# Patient Record
Sex: Male | Born: 1980 | Race: White | Hispanic: No | Marital: Single | Smoking: Current every day smoker
Health system: Southern US, Community
[De-identification: ages and names within clinical notes are randomized; demographics above are authoritative.]

---

## 2019-07-04 ENCOUNTER — Other Ambulatory Visit: Payer: Self-pay

## 2019-07-04 ENCOUNTER — Encounter: Payer: Self-pay | Admitting: Family Medicine

## 2019-07-04 ENCOUNTER — Ambulatory Visit (INDEPENDENT_AMBULATORY_CARE_PROVIDER_SITE_OTHER): Payer: BC Managed Care – PPO | Admitting: Family Medicine

## 2019-07-04 VITALS — BP 132/82 | HR 75 | Temp 98.0°F | Ht 70.08 in | Wt 165.7 lb

## 2019-07-04 DIAGNOSIS — B353 Tinea pedis: Secondary | ICD-10-CM

## 2019-07-04 DIAGNOSIS — Z113 Encounter for screening for infections with a predominantly sexual mode of transmission: Secondary | ICD-10-CM | POA: Diagnosis not present

## 2019-07-04 DIAGNOSIS — E785 Hyperlipidemia, unspecified: Secondary | ICD-10-CM | POA: Diagnosis not present

## 2019-07-04 MED ORDER — KETOCONAZOLE 2 % EX CREA: 1.0000 "application " | TOPICAL_CREAM | Freq: Every day | CUTANEOUS | 0 refills | Status: DC

## 2019-07-04 NOTE — Patient Instructions (Signed)
Great to meet you today! Try ketoconazole to toe area.  We'll be in touch with lab results and recommendations.

## 2019-07-05 LAB — LIPID PANEL
Cholesterol: 184 mg/dL (ref <200)
HDL: 45 mg/dL (ref >=40)
LDL Cholesterol (Calc): 122 mg/dL (calc) — ABNORMAL HIGH
Non-HDL Cholesterol (Calc): 139 mg/dL (calc) — ABNORMAL HIGH (ref <130)
Total CHOL/HDL Ratio: 4.1 (calc) (ref <5.0)
Triglycerides: 80 mg/dL (ref <150)

## 2019-07-05 LAB — COMPLETE METABOLIC PANEL WITH GFR
AG Ratio: 2.2 (calc) (ref 1.0–2.5)
ALT: 21 U/L (ref 9–46)
AST: 21 U/L (ref 10–40)
Albumin: 5 g/dL (ref 3.6–5.1)
Alkaline phosphatase (APISO): 54 U/L (ref 36–130)
BUN: 13 mg/dL (ref 7–25)
CO2: 29 mmol/L (ref 20–32)
Calcium: 9.9 mg/dL (ref 8.6–10.3)
Chloride: 102 mmol/L (ref 98–110)
Creat: 0.99 mg/dL (ref 0.60–1.35)
GFR, Est African American: 112 mL/min/{1.73_m2} (ref >=60)
GFR, Est Non African American: 96 mL/min/{1.73_m2} (ref >=60)
Globulin: 2.3 g/dL (calc) (ref 1.9–3.7)
Glucose, Bld: 102 mg/dL — ABNORMAL HIGH (ref 65–99)
Potassium: 4.5 mmol/L (ref 3.5–5.3)
Sodium: 141 mmol/L (ref 135–146)
Total Bilirubin: 0.8 mg/dL (ref 0.2–1.2)
Total Protein: 7.3 g/dL (ref 6.1–8.1)

## 2019-07-05 LAB — CHLAMYDIA/NEISSERIA GONORRHOEAE RNA,TMA,UROGENTIAL

## 2019-07-05 LAB — RPR: RPR Ser Ql: NONREACTIVE

## 2019-07-05 LAB — HIV ANTIBODY (ROUTINE TESTING W REFLEX): HIV 1&2 Ab, 4th Generation: NONREACTIVE

## 2019-07-07 DIAGNOSIS — E785 Hyperlipidemia, unspecified: Secondary | ICD-10-CM | POA: Insufficient documentation

## 2019-07-07 DIAGNOSIS — Z113 Encounter for screening for infections with a predominantly sexual mode of transmission: Secondary | ICD-10-CM | POA: Insufficient documentation

## 2019-07-07 DIAGNOSIS — B353 Tinea pedis: Secondary | ICD-10-CM | POA: Insufficient documentation

## 2019-07-07 NOTE — Assessment & Plan Note (Signed)
HIV, RPR, GC/CHlamydia ordered.

## 2019-07-07 NOTE — Progress Notes (Signed)
RED MANDT - 39 y.o. male MRN [id number]  Date of birth: [birth year]  Subjective Chief Complaint  Patient presents with  . Establish Care    HPI Bryan Downs is a 39 y.o. male here today for initial visit.  He has been fairly healthy.  Cholesterol has been borderline in the past, due to have this rechecked.  She does smoke occasionally.    He has concern about rash on toe.  He did televisit for this and was prescribed ciclopirox.  Area improved but didn't completely resolve.  He denies pain or itching associated with this.    He would also like to have updated STD screening.  He denies any symptoms at this time.    ROS:  A comprehensive ROS was completed and negative except as noted per HPI  No Known Allergies  History reviewed. No pertinent past medical history.  History reviewed. No pertinent surgical history.  Social History   Socioeconomic History  . Marital status: Single    Spouse name: Not on file  . Number of children: Not on file  . Years of education: Not on file  . Highest education level: Not on file  Occupational History  . Occupation: Journalist  Tobacco Use  . Smoking status: Current Every Day Smoker    Years: 6.00    Types: Cigarettes  . Smokeless tobacco: Never Used  Vaping Use  . Vaping Use: Never used  Substance and Sexual Activity  . Alcohol use: Yes    Alcohol/week: 2.0 standard drinks    Types: 2 Cans of beer per week  . Drug use: Never  . Sexual activity: Yes    Partners: Female  Other Topics Concern  . Not on file  Social History Narrative  . Not on file   Social Determinants of Health   Financial Resource Strain:   . Difficulty of Paying Living Expenses:   Food Insecurity:   . Worried About Charity fundraiser in the Last Year:   . Arboriculturist in the Last Year:   Transportation Needs:   . Film/video editor (Medical):   Marland Kitchen Lack of Transportation (Non-Medical):   Physical Activity:   . Days of Exercise per Week:    . Minutes of Exercise per Session:   Stress:   . Feeling of Stress :   Social Connections:   . Frequency of Communication with Friends and Family:   . Frequency of Social Gatherings with Friends and Family:   . Attends Religious Services:   . Active Member of Clubs or Organizations:   . Attends Archivist Meetings:   Marland Kitchen Marital Status:     History reviewed. No pertinent family history.  Health Maintenance  Topic Date Due  . Hepatitis C Screening  Never done  . TETANUS/TDAP  Never done  . INFLUENZA VACCINE  08/25/2019  . COVID-19 Vaccine  Completed  . HIV Screening  Completed     ----------------------------------------------------------------------------------------------------------------------------------------------------------------------------------------------------------------- Physical Exam BP 132/82 (BP Location: Left Arm, Patient Position: Sitting, Cuff Size: Small)   Pulse 75   Temp 98 F (36.7 C) (Oral)   Ht 5' 10.08" (1.78 m)   Wt 165 lb 11.2 oz (75.2 kg)   SpO2 97%   BMI 23.72 kg/m   Physical Exam Constitutional:      Appearance: Normal appearance.  HENT:     Head: Normocephalic and atraumatic.  Eyes:     General: No scleral icterus. Cardiovascular:     Rate and  Rhythm: Normal rate and regular rhythm.  Pulmonary:     Effort: Pulmonary effort is normal.     Breath sounds: Normal breath sounds.  Musculoskeletal:     Cervical back: Neck supple.  Skin:    General: Skin is warm and dry.  Neurological:     General: No focal deficit present.     Mental Status: He is alert.  Psychiatric:        Mood and Affect: Mood normal.        Behavior: Behavior normal.     ------------------------------------------------------------------------------------------------------------------------------------------------------------------------------------------------------------------- Assessment and Plan  Tinea pedis Improved but not resolved with  ciclopirox.  Will try ketoconazole cream to area.   Screening for STD (sexually transmitted disease) HIV, RPR, GC/CHlamydia ordered.   Hyperlipidemia Check lipids and CMP.    Meds ordered this encounter  Medications  . ketoconazole (NIZORAL) 2 % cream    Sig: Apply 1 application topically daily.    Dispense:  30 g    Refill:  0    No follow-ups on file.    This visit occurred during the SARS-CoV-2 public health emergency.  Safety protocols were in place, including screening questions prior to the visit, additional usage of staff PPE, and extensive cleaning of exam room while observing appropriate contact time as indicated for disinfecting solutions.

## 2019-07-07 NOTE — Assessment & Plan Note (Signed)
Check lipids and CMP.

## 2019-07-07 NOTE — Assessment & Plan Note (Signed)
Improved but not resolved with ciclopirox.  Will try ketoconazole cream to area.

## 2019-07-08 ENCOUNTER — Telehealth: Payer: Self-pay

## 2019-07-08 DIAGNOSIS — Z113 Encounter for screening for infections with a predominantly sexual mode of transmission: Secondary | ICD-10-CM

## 2019-07-08 NOTE — Telephone Encounter (Signed)
Received confirmation that this was collected by [facility].   They are calling the patient for re-collection.   New order pended, please sign if OK

## 2019-07-08 NOTE — Telephone Encounter (Addendum)
Received notification from lab that GC/Chlamydia lab was cancelled due to not receiving suitable specimen. I am having Bryan Downs with [facility] check to see if this was collected in our office or downstairs in the lab

## 2019-07-09 NOTE — Telephone Encounter (Signed)
Order signed.   Thanks!  CM

## 2019-07-17 LAB — C. TRACHOMATIS/N. GONORRHOEAE RNA
C. trachomatis RNA, TMA: NOT DETECTED
N. gonorrhoeae RNA, TMA: NOT DETECTED

## 2019-08-07 ENCOUNTER — Encounter: Payer: Self-pay | Admitting: Family Medicine

## 2019-08-13 ENCOUNTER — Telehealth: Payer: Self-pay

## 2019-08-13 NOTE — Telephone Encounter (Signed)
Pt advised that documentation has been completed and notified.  Copies of documents sent to scan.  Pt to pick-up documents.

## 2019-12-11 ENCOUNTER — Encounter: Payer: BC Managed Care – PPO | Admitting: Family Medicine

## 2019-12-13 ENCOUNTER — Encounter: Payer: BC Managed Care – PPO | Admitting: Family Medicine

## 2019-12-24 ENCOUNTER — Other Ambulatory Visit: Payer: Self-pay

## 2019-12-24 ENCOUNTER — Ambulatory Visit (INDEPENDENT_AMBULATORY_CARE_PROVIDER_SITE_OTHER): Payer: BC Managed Care – PPO | Admitting: Family Medicine

## 2019-12-24 ENCOUNTER — Encounter: Payer: Self-pay | Admitting: Family Medicine

## 2019-12-24 VITALS — BP 134/75 | HR 64 | Temp 97.6°F | Ht 70.08 in | Wt 170.5 lb

## 2019-12-24 DIAGNOSIS — B353 Tinea pedis: Secondary | ICD-10-CM

## 2019-12-24 DIAGNOSIS — Z113 Encounter for screening for infections with a predominantly sexual mode of transmission: Secondary | ICD-10-CM

## 2019-12-24 DIAGNOSIS — Z0000 Encounter for general adult medical examination without abnormal findings: Secondary | ICD-10-CM

## 2019-12-24 DIAGNOSIS — E7800 Pure hypercholesterolemia, unspecified: Secondary | ICD-10-CM

## 2019-12-24 DIAGNOSIS — N529 Male erectile dysfunction, unspecified: Secondary | ICD-10-CM | POA: Insufficient documentation

## 2019-12-24 MED ORDER — SILDENAFIL CITRATE 100 MG PO TABS
50.0000 mg | ORAL_TABLET | Freq: Every day | ORAL | 3 refills | Status: DC | PRN
Start: 2019-12-24 — End: 2019-12-24

## 2019-12-24 MED ORDER — SILDENAFIL CITRATE 100 MG PO TABS
50.0000 mg | ORAL_TABLET | Freq: Every day | ORAL | 3 refills | Status: DC | PRN
Start: 2019-12-24 — End: 2021-01-21

## 2019-12-24 MED ORDER — TERBINAFINE HCL 250 MG PO TABS: 250.0000 mg | ORAL_TABLET | Freq: Every day | ORAL | 0 refills | Status: DC

## 2019-12-24 NOTE — Assessment & Plan Note (Signed)
Update lipid panel.  

## 2019-12-24 NOTE — Patient Instructions (Signed)
Preventive Care 19-39 Years Old, Male Preventive care refers to lifestyle choices and visits with your health care provider that can promote health and wellness. This includes:  A yearly physical exam. This is also called an annual well check.  Regular dental and eye exams.  Immunizations.  Screening for certain conditions.  Healthy lifestyle choices, such as eating a healthy diet, getting regular exercise, not using drugs or products that contain nicotine and tobacco, and limiting alcohol use. What can I expect for my preventive care visit? Physical exam Your health care provider will check:  Height and weight. These may be used to calculate body mass index (BMI), which is a measurement that tells if you are at a healthy weight.  Heart rate and blood pressure.  Your skin for abnormal spots. Counseling Your health care provider may ask you questions about:  Alcohol, tobacco, and drug use.  Emotional well-being.  Home and relationship well-being.  Sexual activity.  Eating habits.  Work and work Statistician. What immunizations do I need?  Influenza (flu) vaccine  This is recommended every year. Tetanus, diphtheria, and pertussis (Tdap) vaccine  You may need a Td booster every 10 years. Varicella (chickenpox) vaccine  You may need this vaccine if you have not already been vaccinated. Human papillomavirus (HPV) vaccine  If recommended by your health care provider, you may need three doses over 6 months. Measles, mumps, and rubella (MMR) vaccine  You may need at least one dose of MMR. You may also need a second dose. Meningococcal conjugate (MenACWY) vaccine  One dose is recommended if you are 45-76 years old and a Market researcher living in a residence hall, or if you have one of several medical conditions. You may also need additional booster doses. Pneumococcal conjugate (PCV13) vaccine  You may need this if you have certain conditions and were not  previously vaccinated. Pneumococcal polysaccharide (PPSV23) vaccine  You may need one or two doses if you smoke cigarettes or if you have certain conditions. Hepatitis A vaccine  You may need this if you have certain conditions or if you travel or work in places where you may be exposed to hepatitis A. Hepatitis B vaccine  You may need this if you have certain conditions or if you travel or work in places where you may be exposed to hepatitis B. Haemophilus influenzae type b (Hib) vaccine  You may need this if you have certain risk factors. You may receive vaccines as individual doses or as more than one vaccine together in one shot (combination vaccines). Talk with your health care provider about the risks and benefits of combination vaccines. What tests do I need? Blood tests  Lipid and cholesterol levels. These may be checked every 5 years starting at age 17.  Hepatitis C test.  Hepatitis B test. Screening   Diabetes screening. This is done by checking your blood sugar (glucose) after you have not eaten for a while (fasting).  Sexually transmitted disease (STD) testing. Talk with your health care provider about your test results, treatment options, and if necessary, the need for more tests. Follow these instructions at home: Eating and drinking   Eat a diet that includes fresh fruits and vegetables, whole grains, lean protein, and low-fat dairy products.  Take vitamin and mineral supplements as recommended by your health care provider.  Do not drink alcohol if your health care provider tells you not to drink.  If you drink alcohol: ? Limit how much you have to 0-2  drinks a day. ? Be aware of how much alcohol is in your drink. In the [place] one drink equals one 12 oz bottle of beer (355 mL), one 5 oz glass of wine (148 mL), or one 1 oz glass of hard liquor (44 mL). Lifestyle  Take daily care of your teeth and gums.  Stay active. Exercise for at least 30 minutes on 5 or  more days each week.  Do not use any products that contain nicotine or tobacco, such as cigarettes, e-cigarettes, and chewing tobacco. If you need help quitting, ask your health care provider.  If you are sexually active, practice safe sex. Use a condom or other form of protection to prevent STIs (sexually transmitted infections). What's next?  Go to your health care provider once a year for a well check visit.  Ask your health care provider how often you should have your eyes and teeth checked.  Stay up to date on all vaccines. This information is not intended to replace advice given to you by your health care provider. Make sure you discuss any questions you have with your health care provider. Document Revised: 01/04/2018 Document Reviewed: 01/04/2018 Elsevier Patient Education  2020 [employer].

## 2019-12-24 NOTE — Assessment & Plan Note (Signed)
Good success with sildenafil previously.  Rx provided.  Side effects and red flags reviewed.

## 2019-12-24 NOTE — Assessment & Plan Note (Signed)
Well adult Orders Placed This Encounter  Procedures  . Chlamydia/Neisseria Gonorrhoeae RNA,TMA,Urogenital  . HIV antibody (with reflex)  . RPR  . COMPLETE METABOLIC PANEL WITH GFR  . Lipid Profile  Screening: STD screening Immunizations: Declines flu vaccine Anticipatory guidance/Risk factor reduction: Recommendations per AVS.

## 2019-12-24 NOTE — Progress Notes (Signed)
Bryan Downs - 39 y.o. male MRN [id number]  Date of birth: Aug 18, 1980  Subjective Chief Complaint  Patient presents with  . Annual Exam    HPI Bryan Downs is a 39 y.o. male here today for annual exam.  He has been in relatively good health.  History of mildly elevated cholesterol.  Would like to have this rechecked as well as STD screening.    Rash on toes has improved with ketoconazole but has not fully resolved.   Also has small nodule on inside of lip he would like looked at.   He would like Rx for sildenafil.  Generally doesn't have issues with erections but will have some difficulty at times.  He has tried sildenafil previously and this worked well for him.    He stays pretty active and diet is pretty good.   He smokes occasionally and has a couple drinks of EtOH weekly.   Review of Systems  Constitutional: Negative for chills, fever, malaise/fatigue and weight loss.  HENT: Negative for congestion, ear pain and sore throat.   Eyes: Negative for blurred vision, double vision and pain.  Respiratory: Negative for cough and shortness of breath.   Cardiovascular: Negative for chest pain and palpitations.  Gastrointestinal: Negative for abdominal pain, blood in stool, constipation, heartburn and nausea.  Genitourinary: Negative for dysuria and urgency.  Musculoskeletal: Negative for joint pain and myalgias.  Neurological: Negative for dizziness and headaches.  Endo/Heme/Allergies: Does not bruise/bleed easily.  Psychiatric/Behavioral: Negative for depression. The patient is not nervous/anxious and does not have insomnia.     No Known Allergies  History reviewed. No pertinent past medical history.  History reviewed. No pertinent surgical history.  Social History   Socioeconomic History  . Marital status: Single    Spouse name: Not on file  . Number of children: Not on file  . Years of education: Not on file  . Highest education level: Not on file  Occupational  History  . Occupation: Journalist  Tobacco Use  . Smoking status: Current Every Day Smoker    Years: 6.00    Types: Cigarettes  . Smokeless tobacco: Never Used  Vaping Use  . Vaping Use: Never used  Substance and Sexual Activity  . Alcohol use: Yes    Alcohol/week: 2.0 standard drinks    Types: 2 Cans of beer per week  . Drug use: Never  . Sexual activity: Yes    Partners: Female  Other Topics Concern  . Not on file  Social History Narrative  . Not on file   Social Determinants of Health   Financial Resource Strain:   . Difficulty of Paying Living Expenses: Not on file  Food Insecurity:   . Worried About Charity fundraiser in the Last Year: Not on file  . Ran Out of Food in the Last Year: Not on file  Transportation Needs:   . Lack of Transportation (Medical): Not on file  . Lack of Transportation (Non-Medical): Not on file  Physical Activity:   . Days of Exercise per Week: Not on file  . Minutes of Exercise per Session: Not on file  Stress:   . Feeling of Stress : Not on file  Social Connections:   . Frequency of Communication with Friends and Family: Not on file  . Frequency of Social Gatherings with Friends and Family: Not on file  . Attends Religious Services: Not on file  . Active Member of Clubs or Organizations: Not on file  .  Attends Archivist Meetings: Not on file  . Marital Status: Not on file    History reviewed. No pertinent family history.  Health Maintenance  Topic Date Due  . Hepatitis C Screening  Never done  . INFLUENZA VACCINE  04/23/2020 (Originally 08/25/2019)  . TETANUS/TDAP  12/23/2020 (Originally 11/06/1999)  . COVID-19 Vaccine  Completed  . HIV Screening  Completed     ----------------------------------------------------------------------------------------------------------------------------------------------------------------------------------------------------------------- Physical Exam BP 134/75 (BP Location: Left Arm,  Patient Position: Sitting, Cuff Size: Normal)   Pulse 64   Temp 97.6 F (36.4 C) (Oral)   Ht 5' 10.08" (1.78 m)   Wt 170 lb 8 oz (77.3 kg)   SpO2 100%   BMI 24.41 kg/m   Physical Exam Constitutional:      General: He is not in acute distress. HENT:     Head: Normocephalic and atraumatic.     Right Ear: Tympanic membrane and external ear normal.     Left Ear: Tympanic membrane and external ear normal.     Mouth/Throat:     Comments: Small nodule on inside of lip consistent with mucocele.  Eyes:     General: No scleral icterus. Neck:     Thyroid: No thyromegaly.  Cardiovascular:     Rate and Rhythm: Normal rate and regular rhythm.     Heart sounds: Normal heart sounds.  Pulmonary:     Effort: Pulmonary effort is normal.     Breath sounds: Normal breath sounds.  Abdominal:     General: Bowel sounds are normal. There is no distension.     Palpations: Abdomen is soft.     Tenderness: There is no abdominal tenderness. There is no guarding.  Musculoskeletal:     Cervical back: Normal range of motion.  Lymphadenopathy:     Cervical: No cervical adenopathy.  Skin:    General: Skin is warm and dry.     Findings: No rash.  Neurological:     Mental Status: He is alert and oriented to person, place, and time.     Cranial Nerves: No cranial nerve deficit.     Motor: No abnormal muscle tone.  Psychiatric:        Mood and Affect: Mood normal.        Behavior: Behavior normal.     ------------------------------------------------------------------------------------------------------------------------------------------------------------------------------------------------------------------- Assessment and Plan  Tinea pedis Improved with ketoconazole but hasn't fully resolved.  Will treat with oral terbinafine.  CMP ordered today.   Hyperlipidemia Update lipid panel.   Well adult exam Well adult Orders Placed This Encounter  Procedures  . Chlamydia/Neisseria Gonorrhoeae  RNA,TMA,Urogenital  . HIV antibody (with reflex)  . RPR  . COMPLETE METABOLIC PANEL WITH GFR  . Lipid Profile  Screening: STD screening Immunizations: Declines flu vaccine Anticipatory guidance/Risk factor reduction: Recommendations per AVS.   ED (erectile dysfunction) Good success with sildenafil previously.  Rx provided.  Side effects and red flags reviewed.    Meds ordered this encounter  Medications  . terbinafine (LAMISIL) 250 MG tablet    Sig: Take 1 tablet (250 mg total) by mouth daily.    Dispense:  42 tablet    Refill:  0  . DISCONTD: sildenafil (VIAGRA) 100 MG tablet    Sig: Take 0.5-1 tablets (50-100 mg total) by mouth daily as needed for erectile dysfunction.    Dispense:  30 tablet    Refill:  3  . sildenafil (VIAGRA) 100 MG tablet    Sig: Take 0.5-1 tablets (50-100 mg total) by mouth daily  as needed for erectile dysfunction.    Dispense:  30 tablet    Refill:  3    No follow-ups on file.    This visit occurred during the SARS-CoV-2 public health emergency.  Safety protocols were in place, including screening questions prior to the visit, additional usage of staff PPE, and extensive cleaning of exam room while observing appropriate contact time as indicated for disinfecting solutions.

## 2019-12-24 NOTE — Assessment & Plan Note (Signed)
Improved with ketoconazole but hasn't fully resolved.  Will treat with oral terbinafine.  CMP ordered today.

## 2019-12-26 ENCOUNTER — Telehealth: Payer: Self-pay | Admitting: Neurology

## 2019-12-26 NOTE — Telephone Encounter (Signed)
Sildenafil PA submitted via covermymeds and approved.   [id number];ELFYBO:[id number];Review Type:Prior Auth;Coverage Start Date:11/26/2019;Coverage End Date:12/25/2020

## 2019-12-27 LAB — COMPLETE METABOLIC PANEL WITH GFR
AG Ratio: 2.4 (calc) (ref 1.0–2.5)
ALT: 25 U/L (ref 9–46)
AST: 20 U/L (ref 10–40)
Albumin: 5.1 g/dL (ref 3.6–5.1)
Alkaline phosphatase (APISO): 53 U/L (ref 36–130)
BUN: 13 mg/dL (ref 7–25)
CO2: 25 mmol/L (ref 20–32)
Calcium: 9.8 mg/dL (ref 8.6–10.3)
Chloride: 102 mmol/L (ref 98–110)
Creat: 0.82 mg/dL (ref 0.60–1.35)
GFR, Est African American: 129 mL/min/{1.73_m2} (ref >=60)
GFR, Est Non African American: 111 mL/min/{1.73_m2} (ref >=60)
Globulin: 2.1 g/dL (calc) (ref 1.9–3.7)
Glucose, Bld: 96 mg/dL (ref 65–99)
Potassium: 4.3 mmol/L (ref 3.5–5.3)
Sodium: 140 mmol/L (ref 135–146)
Total Bilirubin: 1.2 mg/dL (ref 0.2–1.2)
Total Protein: 7.2 g/dL (ref 6.1–8.1)

## 2019-12-27 LAB — CHLAMYDIA/NEISSERIA GONORRHOEAE RNA,TMA,UROGENTIAL
C. trachomatis RNA, TMA: NOT DETECTED
N. gonorrhoeae RNA, TMA: NOT DETECTED

## 2019-12-27 LAB — LIPID PANEL
Cholesterol: 207 mg/dL — ABNORMAL HIGH (ref <200)
HDL: 41 mg/dL (ref >=40)
LDL Cholesterol (Calc): 142 mg/dL (calc) — ABNORMAL HIGH
Non-HDL Cholesterol (Calc): 166 mg/dL (calc) — ABNORMAL HIGH (ref <130)
Total CHOL/HDL Ratio: 5 (calc) — ABNORMAL HIGH (ref <5.0)
Triglycerides: 120 mg/dL (ref <150)

## 2019-12-27 LAB — HIV ANTIBODY (ROUTINE TESTING W REFLEX): HIV 1&2 Ab, 4th Generation: NONREACTIVE

## 2019-12-27 LAB — RPR: RPR Ser Ql: NONREACTIVE

## 2020-10-07 ENCOUNTER — Other Ambulatory Visit: Payer: Self-pay

## 2020-10-07 ENCOUNTER — Ambulatory Visit (INDEPENDENT_AMBULATORY_CARE_PROVIDER_SITE_OTHER): Payer: BC Managed Care – PPO | Admitting: Family Medicine

## 2020-10-07 ENCOUNTER — Encounter: Payer: Self-pay | Admitting: Family Medicine

## 2020-10-07 VITALS — BP 124/81 | HR 80 | Temp 97.6°F | Ht 70.0 in | Wt 164.0 lb

## 2020-10-07 DIAGNOSIS — Z113 Encounter for screening for infections with a predominantly sexual mode of transmission: Secondary | ICD-10-CM

## 2020-10-07 DIAGNOSIS — Z0000 Encounter for general adult medical examination without abnormal findings: Secondary | ICD-10-CM

## 2020-10-07 DIAGNOSIS — Z1159 Encounter for screening for other viral diseases: Secondary | ICD-10-CM

## 2020-10-07 DIAGNOSIS — E785 Hyperlipidemia, unspecified: Secondary | ICD-10-CM | POA: Diagnosis not present

## 2020-10-07 DIAGNOSIS — B353 Tinea pedis: Secondary | ICD-10-CM

## 2020-10-07 MED ORDER — TERBINAFINE HCL 250 MG PO TABS: 250.0000 mg | ORAL_TABLET | Freq: Every day | ORAL | 0 refills | Status: AC

## 2020-10-07 NOTE — Progress Notes (Signed)
Bryan Downs - 40 y.o. male MRN [id number]  Date of birth: 1980-05-14  Subjective Chief Complaint  Patient presents with   Annual Exam    HPI Bryan Downs is a 40 y.o. male here today for annual exam.  He denies any significant changes to his health since last visit.  He has had recurrence of toe fungus.  This improved with lamisil previously.    He does continue to smoke about 1/2 ppd.  He does consume EtOH occasionally.   He does exercise occasionally and feels like his diet is pretty good.   He would like to have STI screening.    Review of Systems  Constitutional:  Negative for chills, fever, malaise/fatigue and weight loss.  HENT:  Negative for congestion, ear pain and sore throat.   Eyes:  Negative for blurred vision, double vision and pain.  Respiratory:  Negative for cough and shortness of breath.   Cardiovascular:  Negative for chest pain and palpitations.  Gastrointestinal:  Negative for abdominal pain, blood in stool, constipation, heartburn and nausea.  Genitourinary:  Negative for dysuria and urgency.  Musculoskeletal:  Negative for joint pain and myalgias.  Neurological:  Negative for dizziness and headaches.  Endo/Heme/Allergies:  Does not bruise/bleed easily.  Psychiatric/Behavioral:  Negative for depression. The patient is not nervous/anxious and does not have insomnia.    No Known Allergies  History reviewed. No pertinent past medical history.  History reviewed. No pertinent surgical history.  Social History   Socioeconomic History   Marital status: Single    Spouse name: Not on file   Number of children: Not on file   Years of education: Not on file   Highest education level: Not on file  Occupational History   Occupation: Journalist  Tobacco Use   Smoking status: Every Day    Years: 6.00    Types: Cigarettes   Smokeless tobacco: Never  Vaping Use   Vaping Use: Never used  Substance and Sexual Activity   Alcohol use: Yes    Alcohol/week: 2.0  standard drinks    Types: 2 Cans of beer per week   Drug use: Never   Sexual activity: Yes    Partners: Female  Other Topics Concern   Not on file  Social History Narrative   Not on file   Social Determinants of Health   Financial Resource Strain: Not on file  Food Insecurity: Not on file  Transportation Needs: Not on file  Physical Activity: Not on file  Stress: Not on file  Social Connections: Not on file    History reviewed. No pertinent family history.  Health Maintenance  Topic Date Due   Pneumococcal Vaccine 43-83 Years old (1 - PCV) Never done   COVID-19 Vaccine (3 - Booster for Pfizer series) 10/11/2019   INFLUENZA VACCINE  Never done   TETANUS/TDAP  12/23/2020 (Originally 11/06/1999)   Hepatitis C Screening  Completed   HIV Screening  Completed   HPV VACCINES  Aged Out     ----------------------------------------------------------------------------------------------------------------------------------------------------------------------------------------------------------------- Physical Exam BP 124/81 (BP Location: Left Arm, Patient Position: Sitting, Cuff Size: Normal)   Pulse 80   Temp 97.6 F (36.4 C)   Ht '5\' 10"'$  (1.778 m)   Wt 164 lb (74.4 kg)   SpO2 96%   BMI 23.53 kg/m   Physical Exam Constitutional:      General: He is not in acute distress. HENT:     Head: Normocephalic and atraumatic.     Right Ear: Tympanic membrane and external  ear normal.     Left Ear: Tympanic membrane and external ear normal.  Eyes:     General: No scleral icterus. Neck:     Thyroid: No thyromegaly.  Cardiovascular:     Rate and Rhythm: Normal rate and regular rhythm.     Heart sounds: Normal heart sounds.  Pulmonary:     Effort: Pulmonary effort is normal.     Breath sounds: Normal breath sounds.  Abdominal:     General: Bowel sounds are normal. There is no distension.     Palpations: Abdomen is soft.     Tenderness: There is no abdominal tenderness. There is  no guarding.  Musculoskeletal:     Cervical back: Normal range of motion.  Lymphadenopathy:     Cervical: No cervical adenopathy.  Skin:    General: Skin is warm and dry.     Findings: No rash.  Neurological:     Mental Status: He is alert and oriented to person, place, and time.     Cranial Nerves: No cranial nerve deficit.     Motor: No abnormal muscle tone.  Psychiatric:        Mood and Affect: Mood normal.        Behavior: Behavior normal.    ------------------------------------------------------------------------------------------------------------------------------------------------------------------------------------------------------------------- Assessment and Plan  Well adult exam Well adult Orders Placed This Encounter  Procedures   Chlamydia/Neisseria Gonorrhoeae RNA,TMA,Urogenital   COMPLETE METABOLIC PANEL WITH GFR   CBC with Differential   Lipid Panel w/reflex Direct LDL   RPR   HIV antibody (with reflex)   Hepatitis C Antibody  Screening:  STI screenings per orders Immunizations:  Declines flu vaccine Anticipatory guidance/Risk factor reduction:  Recommendations per AVS  Tinea pedis Terbinafine renewed.    Meds ordered this encounter  Medications   terbinafine (LAMISIL) 250 MG tablet    Sig: Take 1 tablet (250 mg total) by mouth daily.    Dispense:  42 tablet    Refill:  0    No follow-ups on file.    This visit occurred during the SARS-CoV-2 public health emergency.  Safety protocols were in place, including screening questions prior to the visit, additional usage of staff PPE, and extensive cleaning of exam room while observing appropriate contact time as indicated for disinfecting solutions.

## 2020-10-07 NOTE — Assessment & Plan Note (Signed)
Terbinafine renewed.

## 2020-10-07 NOTE — Patient Instructions (Signed)
Preventive Care 36-40 Years Old, Male Preventive care refers to lifestyle choices and visits with your health care provider that can promote health and wellness. This includes: A yearly physical exam. This is also called an annual wellness visit. Regular dental and eye exams. Immunizations. Screening for certain conditions. Healthy lifestyle choices, such as: Eating a healthy diet. Getting regular exercise. Not using drugs or products that contain nicotine and tobacco. Limiting alcohol use. What can I expect for my preventive care visit? Physical exam Your health care provider may check your: Height and weight. These may be used to calculate your BMI (body mass index). BMI is a measurement that tells if you are at a healthy weight. Heart rate and blood pressure. Body temperature. Skin for abnormal spots. Counseling Your health care provider may ask you questions about your: Past medical problems. Family's medical history. Alcohol, tobacco, and drug use. Emotional well-being. Home life and relationship well-being. Sexual activity. Diet, exercise, and sleep habits. Work and work Statistician. Access to firearms. What immunizations do I need? Vaccines are usually given at various ages, according to a schedule. Your health care provider will recommend vaccines for you based on your age, medical history, and lifestyle or other factors, such as travel or where you work. What tests do I need? Blood tests Lipid and cholesterol levels. These may be checked every 5 years starting at age 61. Hepatitis C test. Hepatitis B test. Screening  Diabetes screening. This is done by checking your blood sugar (glucose) after you have not eaten for a while (fasting). Genital exam to check for testicular cancer or hernias. STD (sexually transmitted disease) testing, if you are at risk. Talk with your health care provider about your test results, treatment options, and if necessary, the need for more  tests. Follow these instructions at home: Eating and drinking  Eat a healthy diet that includes fresh fruits and vegetables, whole grains, lean protein, and low-fat dairy products. Drink enough fluid to keep your urine pale yellow. Take vitamin and mineral supplements as recommended by your health care provider. Do not drink alcohol if your health care provider tells you not to drink. If you drink alcohol: Limit how much you have to 0-2 drinks a day. Be aware of how much alcohol is in your drink. In the [place] one drink equals one 12 oz bottle of beer (355 mL), one 5 oz glass of wine (148 mL), or one 1 oz glass of hard liquor (44 mL). Lifestyle Take daily care of your teeth and gums. Brush your teeth every morning and night with fluoride toothpaste. Floss one time each day. Stay active. Exercise for at least 30 minutes 5 or more days each week. Do not use any products that contain nicotine or tobacco, such as cigarettes, e-cigarettes, and chewing tobacco. If you need help quitting, ask your health care provider. Do not use drugs. If you are sexually active, practice safe sex. Use a condom or other form of protection to prevent STIs (sexually transmitted infections). Find healthy ways to cope with stress, such as: Meditation, yoga, or listening to music. Journaling. Talking to a trusted person. Spending time with friends and family. Safety Always wear your seat belt while driving or riding in a vehicle. Do not drive: If you have been drinking alcohol. Do not ride with someone who has been drinking. When you are tired or distracted. While texting. Wear a helmet and other protective equipment during sports activities. If you have firearms in your house, make sure  you follow all gun safety procedures. Seek help if you have been physically or sexually abused. What's next? Go to your health care provider once a year for an annual wellness visit. Ask your health care provider how often you  should have your eyes and teeth checked. Stay up to date on all vaccines. This information is not intended to replace advice given to you by your health care provider. Make sure you discuss any questions you have with your health care provider. Document Revised: 03/20/2020 Document Reviewed: 01/04/2018 Elsevier Patient Education  2022 [employer].

## 2020-10-07 NOTE — Assessment & Plan Note (Signed)
Well adult Orders Placed This Encounter  Procedures  . Chlamydia/Neisseria Gonorrhoeae RNA,TMA,Urogenital  . COMPLETE METABOLIC PANEL WITH GFR  . CBC with Differential  . Lipid Panel w/reflex Direct LDL  . RPR  . HIV antibody (with reflex)  . Hepatitis C Antibody  Screening:  STI screenings per orders Immunizations:  Declines flu vaccine Anticipatory guidance/Risk factor reduction:  Recommendations per AVS

## 2020-10-09 LAB — LIPID PANEL W/REFLEX DIRECT LDL
Cholesterol: 175 mg/dL (ref <200)
HDL: 45 mg/dL (ref >=40)
LDL Cholesterol (Calc): 112 mg/dL (calc) — ABNORMAL HIGH
Non-HDL Cholesterol (Calc): 130 mg/dL (calc) — ABNORMAL HIGH (ref <130)
Total CHOL/HDL Ratio: 3.9 (calc) (ref <5.0)
Triglycerides: 82 mg/dL (ref <150)

## 2020-10-09 LAB — COMPLETE METABOLIC PANEL WITH GFR
AG Ratio: 2.3 (calc) (ref 1.0–2.5)
ALT: 28 U/L (ref 9–46)
AST: 25 U/L (ref 10–40)
Albumin: 5.1 g/dL (ref 3.6–5.1)
Alkaline phosphatase (APISO): 52 U/L (ref 36–130)
BUN: 13 mg/dL (ref 7–25)
CO2: 30 mmol/L (ref 20–32)
Calcium: 10 mg/dL (ref 8.6–10.3)
Chloride: 102 mmol/L (ref 98–110)
Creat: 0.81 mg/dL (ref 0.60–1.26)
Globulin: 2.2 g/dL (calc) (ref 1.9–3.7)
Glucose, Bld: 79 mg/dL (ref 65–99)
Potassium: 4.4 mmol/L (ref 3.5–5.3)
Sodium: 142 mmol/L (ref 135–146)
Total Bilirubin: 1.5 mg/dL — ABNORMAL HIGH (ref 0.2–1.2)
Total Protein: 7.3 g/dL (ref 6.1–8.1)
eGFR: 115 mL/min/{1.73_m2} (ref >=60)

## 2020-10-09 LAB — HEPATITIS C ANTIBODY
Hepatitis C Ab: NONREACTIVE
SIGNAL TO CUT-OFF: 0.01 (ref <1.00)

## 2020-10-09 LAB — CBC WITH DIFFERENTIAL/PLATELET
Absolute Monocytes: 624 cells/uL (ref 200–950)
Basophils Absolute: 69 cells/uL (ref 0–200)
Basophils Relative: 1.6 %
Eosinophils Absolute: 172 cells/uL (ref 15–500)
Eosinophils Relative: 4 %
HCT: 42.5 % (ref 38.5–50.0)
Hemoglobin: 14.3 g/dL (ref 13.2–17.1)
Lymphs Abs: 950 cells/uL (ref 850–3900)
MCH: 30.8 pg (ref 27.0–33.0)
MCHC: 33.6 g/dL (ref 32.0–36.0)
MCV: 91.4 fL (ref 80.0–100.0)
MPV: 11.7 fL (ref 7.5–12.5)
Monocytes Relative: 14.5 %
Neutro Abs: 2485 cells/uL (ref 1500–7800)
Neutrophils Relative %: 57.8 %
Platelets: 108 10*3/uL — ABNORMAL LOW (ref 140–400)
RBC: 4.65 10*6/uL (ref 4.20–5.80)
RDW: 11.5 % (ref 11.0–15.0)
Total Lymphocyte: 22.1 %
WBC: 4.3 10*3/uL (ref 3.8–10.8)

## 2020-10-09 LAB — HIV ANTIBODY (ROUTINE TESTING W REFLEX): HIV 1&2 Ab, 4th Generation: NONREACTIVE

## 2020-10-09 LAB — CHLAMYDIA/NEISSERIA GONORRHOEAE RNA,TMA,UROGENTIAL
C. trachomatis RNA, TMA: NOT DETECTED
N. gonorrhoeae RNA, TMA: NOT DETECTED

## 2020-10-09 LAB — RPR: RPR Ser Ql: NONREACTIVE

## 2021-01-20 ENCOUNTER — Other Ambulatory Visit: Payer: Self-pay | Admitting: Family Medicine

## 2021-04-08 ENCOUNTER — Ambulatory Visit: Payer: BC Managed Care – PPO | Admitting: Sports Medicine

## 2021-04-08 ENCOUNTER — Other Ambulatory Visit: Payer: Self-pay

## 2021-04-08 ENCOUNTER — Ambulatory Visit (INDEPENDENT_AMBULATORY_CARE_PROVIDER_SITE_OTHER): Payer: BC Managed Care – PPO

## 2021-04-08 DIAGNOSIS — R0789 Other chest pain: Secondary | ICD-10-CM | POA: Insufficient documentation

## 2021-04-08 DIAGNOSIS — R0781 Pleurodynia: Secondary | ICD-10-CM | POA: Diagnosis not present

## 2021-04-08 DIAGNOSIS — E785 Hyperlipidemia, unspecified: Secondary | ICD-10-CM | POA: Diagnosis not present

## 2021-04-08 DIAGNOSIS — M542 Cervicalgia: Secondary | ICD-10-CM

## 2021-04-08 MED ORDER — PREDNISONE 50 MG PO TABS: ORAL_TABLET | ORAL | 0 refills | Status: DC

## 2021-04-08 NOTE — Assessment & Plan Note (Signed)
A pleasant 41 year old male, for the past several days has had increasing pain left posterior chest wall with radiation around to the left mid to upper chest, worse with rotating his torso to the right, as well as worse with protraction of his left shoulder. ?On exam he has good motion and good strength of his neck, shoulder, there are no discrete areas of tenderness to palpation along the rhomboids, serratus anterior, chest wall, costal cartilage. ?Pain is not pleuritic, he has no constitutional symptoms, no viral symptoms. ?No change in physical activity, diet, no trauma. ?Unclear etiology, likely serratus versus rhomboid strain. ?We will start with 5 days of prednisone, left-sided rib series x-rays, cervical spine x-rays. ?Rhomboid stretching. ?Return to see me in approximately 2 to 4 weeks for reevaluation. ? ?

## 2021-04-08 NOTE — Progress Notes (Signed)
? ? ?  Procedures performed today:   ? ?None. ? ?Independent interpretation of notes and tests performed by another provider:  ? ?None. ? ?Brief History, Exam, Impression, and Recommendations:   ? ?Left-sided chest wall pain ?A pleasant 41 year old male, for the past several days has had increasing pain left posterior chest wall with radiation around to the left mid to upper chest, worse with rotating his torso to the right, as well as worse with protraction of his left shoulder. ?On exam he has good motion and good strength of his neck, shoulder, there are no discrete areas of tenderness to palpation along the rhomboids, serratus anterior, chest wall, costal cartilage. ?Pain is not pleuritic, he has no constitutional symptoms, no viral symptoms. ?No change in physical activity, diet, no trauma. ?Unclear etiology, likely serratus versus rhomboid strain. ?We will start with 5 days of prednisone, left-sided rib series x-rays, cervical spine x-rays. ?Rhomboid stretching. ?Return to see me in approximately 2 to 4 weeks for reevaluation. ? ? ?Hyperlipidemia ?Repeat lipid panel per patient request as he is fasting today, we will forward the results to Dr. Zigmund Daniel. ? ? ? ?___________________________________________ ?Gwen Her. Dianah Field, M.D., ABFM., CAQSM. ?Primary Care and Sports Medicine ?[place] ? ?Adjunct Instructor of Family Medicine  ?[employer] ?

## 2021-04-08 NOTE — Assessment & Plan Note (Signed)
Repeat lipid panel per patient request as he is fasting today, we will forward the results to Dr. Zigmund Daniel. ?

## 2021-04-09 LAB — LIPID PANEL
Cholesterol: 199 mg/dL (ref <200)
HDL: 45 mg/dL (ref >=40)
LDL Cholesterol (Calc): 136 mg/dL (calc) — ABNORMAL HIGH
Non-HDL Cholesterol (Calc): 154 mg/dL (calc) — ABNORMAL HIGH (ref <130)
Total CHOL/HDL Ratio: 4.4 (calc) (ref <5.0)
Triglycerides: 83 mg/dL (ref <150)

## 2021-04-09 LAB — COMPREHENSIVE METABOLIC PANEL
AG Ratio: 2.3 (calc) (ref 1.0–2.5)
ALT: 21 U/L (ref 9–46)
AST: 19 U/L (ref 10–40)
Albumin: 5 g/dL (ref 3.6–5.1)
Alkaline phosphatase (APISO): 45 U/L (ref 36–130)
BUN: 13 mg/dL (ref 7–25)
CO2: 30 mmol/L (ref 20–32)
Calcium: 9.9 mg/dL (ref 8.6–10.3)
Chloride: 102 mmol/L (ref 98–110)
Creat: 0.79 mg/dL (ref 0.60–1.29)
Globulin: 2.2 g/dL (calc) (ref 1.9–3.7)
Glucose, Bld: 98 mg/dL (ref 65–99)
Potassium: 4 mmol/L (ref 3.5–5.3)
Sodium: 141 mmol/L (ref 135–146)
Total Bilirubin: 0.9 mg/dL (ref 0.2–1.2)
Total Protein: 7.2 g/dL (ref 6.1–8.1)

## 2021-04-12 ENCOUNTER — Encounter: Payer: Self-pay | Admitting: Sports Medicine

## 2021-07-28 ENCOUNTER — Ambulatory Visit: Payer: BC Managed Care – PPO | Admitting: Physician Assistant

## 2021-07-28 ENCOUNTER — Encounter: Payer: Self-pay | Admitting: Physician Assistant

## 2021-07-28 DIAGNOSIS — Z202 Contact with and (suspected) exposure to infections with a predominantly sexual mode of transmission: Secondary | ICD-10-CM

## 2021-07-28 DIAGNOSIS — N529 Male erectile dysfunction, unspecified: Secondary | ICD-10-CM

## 2021-07-28 DIAGNOSIS — Z113 Encounter for screening for infections with a predominantly sexual mode of transmission: Secondary | ICD-10-CM | POA: Diagnosis not present

## 2021-07-28 MED ORDER — SILDENAFIL CITRATE 100 MG PO TABS: ORAL_TABLET | ORAL | 3 refills | Status: DC

## 2021-07-28 NOTE — Progress Notes (Signed)
   Established Patient Office Visit  Subjective   Patient ID: Bryan Downs, male    DOB: [birth year]  Age: 41 y.o. MRN: [id number]  No chief complaint on file.   HPI Pt is a 41 yo male who presents to the clinic with concerns after exposure to chlamydia. He was sexually active with partner about 5 weeks ago but just learned of positive testing recently. Pt is asymptomatic of any STD symptoms. He has not been sexual active since previous exposure.   Pt would like viagra for ED. No problems or concerns.  .. Active Ambulatory Problems    Diagnosis Date Noted   Tinea pedis 07/07/2019   Hyperlipidemia 07/07/2019   Screening for STD (sexually transmitted disease) 07/07/2019   Well adult exam 12/24/2019   ED (erectile dysfunction) 12/24/2019   Left-sided chest wall pain 04/08/2021   Resolved Ambulatory Problems    Diagnosis Date Noted   No Resolved Ambulatory Problems   No Additional Past Medical History    Review of Systems  All other systems reviewed and are negative.     Objective:     There were no vitals taken for this visit. BP Readings from Last 3 Encounters:  04/08/21 135/89  10/07/20 124/81  12/24/19 134/75      Physical Exam Constitutional:      Appearance: Normal appearance.  HENT:     Head: Normocephalic.  Cardiovascular:     Rate and Rhythm: Normal rate.  Pulmonary:     Effort: Pulmonary effort is normal.  Neurological:     General: No focal deficit present.     Mental Status: He is alert and oriented to person, place, and time.  Psychiatric:        Mood and Affect: Mood normal.        Assessment & Plan:  Marland KitchenMarland KitchenDiagnoses and all orders for this visit:  STD exposure -     C. trachomatis/N. gonorrhoeae RNA -     HIV antibody (with reflex) -     RPR -     COMPLETE METABOLIC PANEL WITH GFR  Screening for STD (sexually transmitted disease) -     C. trachomatis/N. gonorrhoeae RNA -     HIV antibody (with reflex) -     RPR -     COMPLETE  METABOLIC PANEL WITH GFR  Erectile dysfunction, unspecified erectile dysfunction type -     COMPLETE METABOLIC PANEL WITH GFR -     sildenafil (VIAGRA) 100 MG tablet; TAKE 1/2 TO 1 TABLET BY MOUTH DAILY AS NEEDED FOR ERECTILE DYSFUNCTION  Full STI testing ordered Discussed STI prevention with condom use Viagra refilled Cmp ordered    Iran Planas, PA-C

## 2021-07-29 LAB — COMPLETE METABOLIC PANEL WITH GFR
AG Ratio: 2 (calc) (ref 1.0–2.5)
ALT: 25 U/L (ref 9–46)
AST: 22 U/L (ref 10–40)
Albumin: 4.9 g/dL (ref 3.6–5.1)
Alkaline phosphatase (APISO): 45 U/L (ref 36–130)
BUN: 8 mg/dL (ref 7–25)
CO2: 29 mmol/L (ref 20–32)
Calcium: 10.1 mg/dL (ref 8.6–10.3)
Chloride: 102 mmol/L (ref 98–110)
Creat: 0.74 mg/dL (ref 0.60–1.29)
Globulin: 2.5 g/dL (calc) (ref 1.9–3.7)
Glucose, Bld: 108 mg/dL — ABNORMAL HIGH (ref 65–99)
Potassium: 3.7 mmol/L (ref 3.5–5.3)
Sodium: 141 mmol/L (ref 135–146)
Total Bilirubin: 0.8 mg/dL (ref 0.2–1.2)
Total Protein: 7.4 g/dL (ref 6.1–8.1)
eGFR: 117 mL/min/{1.73_m2} (ref >=60)

## 2021-07-29 LAB — HIV ANTIBODY (ROUTINE TESTING W REFLEX): HIV 1&2 Ab, 4th Generation: NONREACTIVE

## 2021-07-29 LAB — C. TRACHOMATIS/N. GONORRHOEAE RNA
C. trachomatis RNA, TMA: NOT DETECTED
N. gonorrhoeae RNA, TMA: NOT DETECTED

## 2021-07-29 LAB — RPR: RPR Ser Ql: NONREACTIVE

## 2021-07-30 ENCOUNTER — Encounter: Payer: Self-pay | Admitting: Physician Assistant

## 2021-07-30 NOTE — Progress Notes (Signed)
No STD detected. Kidney and liver look great.

## 2021-10-19 ENCOUNTER — Ambulatory Visit (INDEPENDENT_AMBULATORY_CARE_PROVIDER_SITE_OTHER): Payer: BC Managed Care – PPO | Admitting: Family Medicine

## 2021-10-19 ENCOUNTER — Encounter: Payer: Self-pay | Admitting: Family Medicine

## 2021-10-19 VITALS — BP 116/73 | HR 78 | Ht 70.0 in | Wt 171.0 lb

## 2021-10-19 DIAGNOSIS — Z113 Encounter for screening for infections with a predominantly sexual mode of transmission: Secondary | ICD-10-CM | POA: Diagnosis not present

## 2021-10-19 DIAGNOSIS — N529 Male erectile dysfunction, unspecified: Secondary | ICD-10-CM

## 2021-10-19 DIAGNOSIS — Z13220 Encounter for screening for lipoid disorders: Secondary | ICD-10-CM

## 2021-10-19 DIAGNOSIS — Z0000 Encounter for general adult medical examination without abnormal findings: Secondary | ICD-10-CM | POA: Diagnosis not present

## 2021-10-19 DIAGNOSIS — Z23 Encounter for immunization: Secondary | ICD-10-CM | POA: Diagnosis not present

## 2021-10-19 DIAGNOSIS — D485 Neoplasm of uncertain behavior of skin: Secondary | ICD-10-CM

## 2021-10-19 MED ORDER — SILDENAFIL CITRATE 100 MG PO TABS: ORAL_TABLET | ORAL | 3 refills | Status: DC

## 2021-10-19 NOTE — Progress Notes (Signed)
Bryan Downs - 41 y.o. male MRN [id number]  Date of birth: Apr 28, 1980  Subjective Chief Complaint  Patient presents with   Annual Exam    HPI Bryan Downs is a 41 y.o. male here today for annual exam.   He has not had any significant health changes since last year.  Weight is up slightly since last year.  He is exercising 2-3 days per week.  Feels that his diet is OK.  He is a daily smoker, has cut back to 3-4 cigarettes/day. Consumes EtOH a few days per week.   He is due for Tdap and flu vaccine.  He would like to have updated Tdap today.   Review of Systems  Constitutional:  Negative for chills, fever, malaise/fatigue and weight loss.  HENT:  Negative for congestion, ear pain and sore throat.   Eyes:  Negative for blurred vision, double vision and pain.  Respiratory:  Negative for cough and shortness of breath.   Cardiovascular:  Negative for chest pain and palpitations.  Gastrointestinal:  Negative for abdominal pain, blood in stool, constipation, heartburn and nausea.  Genitourinary:  Negative for dysuria and urgency.  Musculoskeletal:  Negative for joint pain and myalgias.  Neurological:  Negative for dizziness and headaches.  Endo/Heme/Allergies:  Does not bruise/bleed easily.  Psychiatric/Behavioral:  Negative for depression. The patient is not nervous/anxious and does not have insomnia.     No Known Allergies  History reviewed. No pertinent past medical history.  History reviewed. No pertinent surgical history.  Social History   Socioeconomic History   Marital status: Single    Spouse name: Not on file   Number of children: Not on file   Years of education: Not on file   Highest education level: Not on file  Occupational History   Occupation: Journalist  Tobacco Use   Smoking status: Every Day    Years: 6.00    Types: Cigarettes   Smokeless tobacco: Never  Vaping Use   Vaping Use: Never used  Substance and Sexual Activity   Alcohol use: Yes     Alcohol/week: 2.0 standard drinks of alcohol    Types: 2 Cans of beer per week   Drug use: Never   Sexual activity: Yes    Partners: Female  Other Topics Concern   Not on file  Social History Narrative   Not on file   Social Determinants of Health   Financial Resource Strain: Not on file  Food Insecurity: Not on file  Transportation Needs: Not on file  Physical Activity: Not on file  Stress: Not on file  Social Connections: Not on file    History reviewed. No pertinent family history.  Health Maintenance  Topic Date Due   COVID-19 Vaccine (3 - Pfizer series) 02/24/2022 (Originally 07/06/2019)   INFLUENZA VACCINE  04/24/2022 (Originally 08/24/2021)   TETANUS/TDAP  10/20/2031   Hepatitis C Screening  Completed   HIV Screening  Completed   HPV VACCINES  Aged Out     ----------------------------------------------------------------------------------------------------------------------------------------------------------------------------------------------------------------- Physical Exam BP 132/78 (BP Location: Left Arm, Patient Position: Sitting, Cuff Size: Normal)   Pulse 78   Ht '5\' 10"'$  (1.778 m)   Wt 171 lb (77.6 kg)   SpO2 97%   BMI 24.54 kg/m   Physical Exam Constitutional:      General: He is not in acute distress. HENT:     Head: Normocephalic and atraumatic.     Right Ear: Tympanic membrane and external ear normal.     Left Ear:  Tympanic membrane and external ear normal.  Eyes:     General: No scleral icterus. Neck:     Thyroid: No thyromegaly.  Cardiovascular:     Rate and Rhythm: Normal rate and regular rhythm.     Heart sounds: Normal heart sounds.  Pulmonary:     Effort: Pulmonary effort is normal.     Breath sounds: Normal breath sounds.  Abdominal:     General: Bowel sounds are normal. There is no distension.     Palpations: Abdomen is soft.     Tenderness: There is no abdominal tenderness. There is no guarding.  Musculoskeletal:     Cervical  back: Normal range of motion.  Lymphadenopathy:     Cervical: No cervical adenopathy.  Skin:    General: Skin is warm and dry.     Findings: No rash.  Neurological:     Mental Status: He is alert and oriented to person, place, and time.     Cranial Nerves: No cranial nerve deficit.     Motor: No abnormal muscle tone.  Psychiatric:        Mood and Affect: Mood normal.        Behavior: Behavior normal.     ------------------------------------------------------------------------------------------------------------------------------------------------------------------------------------------------------------------- Assessment and Plan  Well adult exam Well adult Orders Placed This Encounter  Procedures   Chlamydia/Neisseria Gonorrhoeae RNA,TMA,Urogenital   Tdap vaccine greater than or equal to 7yo IM   COMPLETE METABOLIC PANEL WITH GFR   CBC with Differential   Lipid Panel w/reflex Direct LDL   TSH   HIV antibody (with reflex)   RPR   Ambulatory referral to Dermatology    Referral Priority:   Routine    Referral Type:   Consultation    Referral Reason:   Specialty Services Required    Requested Specialty:   Dermatology    Number of Visits Requested:   1  Screenings: per lab orders.  Requests referral to dermatology.  Immunizations: Tdap.  Declines flu vaccine.  Anticipatory guidance/Risk factor reduction:  Recommendations per AVS.    Meds ordered this encounter  Medications   sildenafil (VIAGRA) 100 MG tablet    Sig: TAKE 1/2 TO 1 TABLET BY MOUTH DAILY AS NEEDED FOR ERECTILE DYSFUNCTION    Dispense:  30 tablet    Refill:  3    No follow-ups on file.    This visit occurred during the SARS-CoV-2 public health emergency.  Safety protocols were in place, including screening questions prior to the visit, additional usage of staff PPE, and extensive cleaning of exam room while observing appropriate contact time as indicated for disinfecting solutions.

## 2021-10-19 NOTE — Assessment & Plan Note (Signed)
Well adult Orders Placed This Encounter  Procedures  . Chlamydia/Neisseria Gonorrhoeae RNA,TMA,Urogenital  . Tdap vaccine greater than or equal to 41yo IM  . COMPLETE METABOLIC PANEL WITH GFR  . CBC with Differential  . Lipid Panel w/reflex Direct LDL  . TSH  . HIV antibody (with reflex)  . RPR  . Ambulatory referral to Dermatology    Referral Priority:   Routine    Referral Type:   Consultation    Referral Reason:   Specialty Services Required    Requested Specialty:   Dermatology    Number of Visits Requested:   1  Screenings: per lab orders.  Requests referral to dermatology.  Immunizations: Tdap.  Declines flu vaccine.  Anticipatory guidance/Risk factor reduction:  Recommendations per AVS.

## 2021-10-19 NOTE — Patient Instructions (Signed)

## 2021-10-21 LAB — COMPLETE METABOLIC PANEL WITH GFR
AG Ratio: 1.9 (calc) (ref 1.0–2.5)
ALT: 36 U/L (ref 9–46)
AST: 44 U/L — ABNORMAL HIGH (ref 10–40)
Albumin: 4.6 g/dL (ref 3.6–5.1)
Alkaline phosphatase (APISO): 49 U/L (ref 36–130)
BUN: 12 mg/dL (ref 7–25)
CO2: 30 mmol/L (ref 20–32)
Calcium: 9.7 mg/dL (ref 8.6–10.3)
Chloride: 103 mmol/L (ref 98–110)
Creat: 0.89 mg/dL (ref 0.60–1.29)
Globulin: 2.4 g/dL (calc) (ref 1.9–3.7)
Glucose, Bld: 94 mg/dL (ref 65–99)
Potassium: 4.2 mmol/L (ref 3.5–5.3)
Sodium: 140 mmol/L (ref 135–146)
Total Bilirubin: 1.2 mg/dL (ref 0.2–1.2)
Total Protein: 7 g/dL (ref 6.1–8.1)
eGFR: 111 mL/min/{1.73_m2} (ref >=60)

## 2021-10-21 LAB — HIV ANTIBODY (ROUTINE TESTING W REFLEX): HIV 1&2 Ab, 4th Generation: NONREACTIVE

## 2021-10-21 LAB — CBC WITH DIFFERENTIAL/PLATELET
Absolute Monocytes: 508 cells/uL (ref 200–950)
Basophils Absolute: 9 cells/uL (ref 0–200)
Basophils Relative: 0.2 %
Eosinophils Absolute: 259 cells/uL (ref 15–500)
Eosinophils Relative: 5.5 %
HCT: 41.6 % (ref 38.5–50.0)
Hemoglobin: 13.8 g/dL (ref 13.2–17.1)
Lymphs Abs: 1744 cells/uL (ref 850–3900)
MCH: 29.3 pg (ref 27.0–33.0)
MCHC: 33.2 g/dL (ref 32.0–36.0)
MCV: 88.3 fL (ref 80.0–100.0)
MPV: 11.4 fL (ref 7.5–12.5)
Monocytes Relative: 10.8 %
Neutro Abs: 2181 cells/uL (ref 1500–7800)
Neutrophils Relative %: 46.4 %
Platelets: 150 10*3/uL (ref 140–400)
RBC: 4.71 10*6/uL (ref 4.20–5.80)
RDW: 11.8 % (ref 11.0–15.0)
Total Lymphocyte: 37.1 %
WBC: 4.7 10*3/uL (ref 3.8–10.8)

## 2021-10-21 LAB — LIPID PANEL W/REFLEX DIRECT LDL
Cholesterol: 165 mg/dL (ref <200)
HDL: 46 mg/dL (ref >=40)
LDL Cholesterol (Calc): 102 mg/dL (calc) — ABNORMAL HIGH
Non-HDL Cholesterol (Calc): 119 mg/dL (calc) (ref <130)
Total CHOL/HDL Ratio: 3.6 (calc) (ref <5.0)
Triglycerides: 82 mg/dL (ref <150)

## 2021-10-21 LAB — RPR: RPR Ser Ql: NONREACTIVE

## 2021-10-21 LAB — CHLAMYDIA/NEISSERIA GONORRHOEAE RNA,TMA,UROGENTIAL
C. trachomatis RNA, TMA: NOT DETECTED
N. gonorrhoeae RNA, TMA: NOT DETECTED

## 2021-10-21 LAB — TSH: TSH: 1.46 mIU/L (ref 0.40–4.50)

## 2021-10-27 ENCOUNTER — Encounter: Payer: Self-pay | Admitting: Family Medicine

## 2022-04-06 ENCOUNTER — Ambulatory Visit: Payer: BC Managed Care – PPO | Admitting: Family Medicine

## 2022-04-07 ENCOUNTER — Encounter: Payer: Self-pay | Admitting: Family Medicine

## 2022-04-07 ENCOUNTER — Ambulatory Visit: Payer: BC Managed Care – PPO | Admitting: Family Medicine

## 2022-04-07 VITALS — BP 142/85 | HR 83 | Ht 70.0 in | Wt 174.0 lb

## 2022-04-07 DIAGNOSIS — E785 Hyperlipidemia, unspecified: Secondary | ICD-10-CM

## 2022-04-07 DIAGNOSIS — R0982 Postnasal drip: Secondary | ICD-10-CM

## 2022-04-07 DIAGNOSIS — Z113 Encounter for screening for infections with a predominantly sexual mode of transmission: Secondary | ICD-10-CM

## 2022-04-07 MED ORDER — FLUTICASONE PROPIONATE 50 MCG/ACT NA SUSP: 2.0000 | Freq: Every day | NASAL | 6 refills | Status: AC

## 2022-04-07 NOTE — Assessment & Plan Note (Signed)
Reassured about cobblestoning appearance.  Likley 2/2 to post nasal drip.  Adding flonase and recommend adding antihistamine.  He'll let me know if symptoms persist.

## 2022-04-07 NOTE — Assessment & Plan Note (Signed)
He would like to have updated STI testing.  Orders Placed This Encounter  Procedures   Chlamydia/Neisseria Gonorrhoeae RNA,TMA,Urogenital   Trichomonas vaginalis, RNA   COMPLETE METABOLIC PANEL WITH GFR   Lipid Panel w/reflex Direct LDL   HIV antibody (with reflex)   RPR

## 2022-04-07 NOTE — Progress Notes (Signed)
Bryan Downs - 42 y.o. male MRN [id number]  Date of birth: 1980/09/16  Subjective Chief Complaint  Patient presents with   Sore Throat    HPI [name] is a 42 y.o. male here to day with complaint of spots in the back of the throat.  First noticed a couple of weeks ago.  He has not had sore throat, pain with swallowing, enalarged lymph nodes, fever or chills.  He has had mild cough and maybe some occasional nasal congestion.  Denies significant reflux symptoms. He is a former smoker.   ROS:  A comprehensive ROS was completed and negative except as noted per HPI  No Known Allergies  History reviewed. No pertinent past medical history.  History reviewed. No pertinent surgical history.  Social History   Socioeconomic History   Marital status: Single    Spouse name: Not on file   Number of children: Not on file   Years of education: Not on file   Highest education level: Not on file  Occupational History   Occupation: Journalist  Tobacco Use   Smoking status: Every Day    Years: 6    Types: Cigarettes   Smokeless tobacco: Never  Vaping Use   Vaping Use: Never used  Substance and Sexual Activity   Alcohol use: Yes    Alcohol/week: 2.0 standard drinks of alcohol    Types: 2 Cans of beer per week   Drug use: Never   Sexual activity: Yes    Partners: Female  Other Topics Concern   Not on file  Social History Narrative   Not on file   Social Determinants of Health   Financial Resource Strain: Not on file  Food Insecurity: Not on file  Transportation Needs: Not on file  Physical Activity: Not on file  Stress: Not on file  Social Connections: Not on file    History reviewed. No pertinent family history.  Health Maintenance  Topic Date Due   COVID-19 Vaccine (3 - Pfizer risk series) 06/08/2019   INFLUENZA VACCINE  04/24/2022 (Originally 08/24/2021)   DTaP/Tdap/Td (2 - Td or Tdap) 10/20/2031   Hepatitis C Screening  Completed   HIV Screening  Completed    HPV VACCINES  Aged Out     ----------------------------------------------------------------------------------------------------------------------------------------------------------------------------------------------------------------- Physical Exam BP (!) 142/85 (BP Location: Left Arm, Patient Position: Sitting, Cuff Size: Normal)   Pulse 83   Ht '5\' 10"'$  (1.778 m)   Wt 174 lb (78.9 kg)   SpO2 97%   BMI 24.97 kg/m   Physical Exam Constitutional:      Appearance: He is well-developed.  HENT:     Head: Normocephalic and atraumatic.     Mouth/Throat:     Mouth: Mucous membranes are moist. No oral lesions.     Pharynx: Uvula midline. No pharyngeal swelling or oropharyngeal exudate.     Tonsils: No tonsillar exudate or tonsillar abscesses.     Comments: Cobblestoning noted in the posterior oropharynx.  Lymphadenopathy:     Cervical: No cervical adenopathy.  Neurological:     Mental Status: He is alert.     ------------------------------------------------------------------------------------------------------------------------------------------------------------------------------------------------------------------- Assessment and Plan  Hyperlipidemia Updating lipid panel.   Screening for STD (sexually transmitted disease) He would like to have updated STI testing.  Orders Placed This Encounter  Procedures   Chlamydia/Neisseria Gonorrhoeae RNA,TMA,Urogenital   Trichomonas vaginalis, RNA   COMPLETE METABOLIC PANEL WITH GFR   Lipid Panel w/reflex Direct LDL   HIV antibody (with reflex)   RPR  Post-nasal drainage Reassured about cobblestoning appearance.  Likley 2/2 to post nasal drip.  Adding flonase and recommend adding antihistamine.  He'll let me know if symptoms persist.     Meds ordered this encounter  Medications   fluticasone (FLONASE) 50 MCG/ACT nasal spray    Sig: Place 2 sprays into both nostrils daily.    Dispense:  16 g    Refill:  6    No  follow-ups on file.    This visit occurred during the SARS-CoV-2 public health emergency.  Safety protocols were in place, including screening questions prior to the visit, additional usage of staff PPE, and extensive cleaning of exam room while observing appropriate contact time as indicated for disinfecting solutions.

## 2022-04-07 NOTE — Assessment & Plan Note (Signed)
Updating lipid panel.  ?

## 2022-04-07 NOTE — Patient Instructions (Addendum)
Try adding antihistamine such as allegra or zyrtec (generic is fine) Try flonase daily x2 weeks Let me know if you do not note improvement with this.

## 2022-04-10 LAB — LIPID PANEL W/REFLEX DIRECT LDL
Cholesterol: 196 mg/dL (ref <200)
HDL: 46 mg/dL (ref >=40)
LDL Cholesterol (Calc): 134 mg/dL (calc) — ABNORMAL HIGH
Non-HDL Cholesterol (Calc): 150 mg/dL (calc) — ABNORMAL HIGH (ref <130)
Total CHOL/HDL Ratio: 4.3 (calc) (ref <5.0)
Triglycerides: 69 mg/dL (ref <150)

## 2022-04-10 LAB — COMPLETE METABOLIC PANEL WITH GFR
AG Ratio: 2.1 (calc) (ref 1.0–2.5)
ALT: 33 U/L (ref 9–46)
AST: 23 U/L (ref 10–40)
Albumin: 5 g/dL (ref 3.6–5.1)
Alkaline phosphatase (APISO): 49 U/L (ref 36–130)
BUN: 14 mg/dL (ref 7–25)
CO2: 30 mmol/L (ref 20–32)
Calcium: 9.9 mg/dL (ref 8.6–10.3)
Chloride: 102 mmol/L (ref 98–110)
Creat: 0.9 mg/dL (ref 0.60–1.29)
Globulin: 2.4 g/dL (calc) (ref 1.9–3.7)
Glucose, Bld: 92 mg/dL (ref 65–99)
Potassium: 3.9 mmol/L (ref 3.5–5.3)
Sodium: 141 mmol/L (ref 135–146)
Total Bilirubin: 0.8 mg/dL (ref 0.2–1.2)
Total Protein: 7.4 g/dL (ref 6.1–8.1)
eGFR: 110 mL/min/{1.73_m2} (ref >=60)

## 2022-04-10 LAB — TRICHOMONAS VAGINALIS, PROBE AMP: Trichomonas vaginalis RNA: NOT DETECTED

## 2022-04-10 LAB — CHLAMYDIA/NEISSERIA GONORRHOEAE RNA,TMA,UROGENTIAL
C. trachomatis RNA, TMA: NOT DETECTED
N. gonorrhoeae RNA, TMA: NOT DETECTED

## 2022-04-10 LAB — RPR: RPR Ser Ql: NONREACTIVE

## 2022-04-10 LAB — HIV ANTIBODY (ROUTINE TESTING W REFLEX): HIV 1&2 Ab, 4th Generation: NONREACTIVE

## 2022-08-10 ENCOUNTER — Ambulatory Visit: Payer: BC Managed Care – PPO | Admitting: Family Medicine

## 2022-08-11 ENCOUNTER — Encounter: Payer: Self-pay | Admitting: Family Medicine

## 2022-08-11 ENCOUNTER — Ambulatory Visit: Payer: BC Managed Care – PPO | Admitting: Family Medicine

## 2022-08-11 VITALS — BP 125/85 | HR 66 | Ht 70.0 in | Wt 178.0 lb

## 2022-08-11 DIAGNOSIS — J392 Other diseases of pharynx: Secondary | ICD-10-CM

## 2022-08-11 DIAGNOSIS — Z113 Encounter for screening for infections with a predominantly sexual mode of transmission: Secondary | ICD-10-CM | POA: Diagnosis not present

## 2022-08-11 DIAGNOSIS — F418 Other specified anxiety disorders: Secondary | ICD-10-CM

## 2022-08-11 MED ORDER — HYDROXYZINE HCL 25 MG PO TABS: 25.0000 mg | ORAL_TABLET | Freq: Three times a day (TID) | ORAL | 1 refills | Status: DC | PRN

## 2022-08-11 MED ORDER — HYDROXYZINE HCL 25 MG PO TABS: 25.0000 mg | ORAL_TABLET | Freq: Three times a day (TID) | ORAL | 1 refills | Status: AC | PRN

## 2022-08-11 NOTE — Assessment & Plan Note (Signed)
Continued posterior OP irritation.  Referral placed to ENT.

## 2022-08-11 NOTE — Progress Notes (Signed)
Bryan Downs - 42 y.o. male MRN [id number]  Date of birth: 03/14/1980  Subjective Chief Complaint  Patient presents with   Follow-up    HPI Bryan Downs is a 42 y.o. male here today for follow up.   He is concerned about spot in the back of the throat.  He was seen in March and noted to have cobblestoning appearance of the posterior OP.  Since that time he reports that area comes and goes.  He denies pain associated with it.  No difficulty swallowing.  Denies reflux symptoms except on occasion with acidic foods.  He has also noted a sore area in the roof of the mouth.  This is improving.    He has had some increased anxiety recently.   Has been on medication for this in the past.  He is having some sleeping difficulty.  Requesting something to use as needed for anxiety.    He has a new partner and would like to have updated STI screening  ROS:  A comprehensive ROS was completed and negative except as noted per HPI  No Known Allergies  History reviewed. No pertinent past medical history.  History reviewed. No pertinent surgical history.  Social History   Socioeconomic History   Marital status: Single    Spouse name: Not on file   Number of children: Not on file   Years of education: Not on file   Highest education level: Not on file  Occupational History   Occupation: Journalist  Tobacco Use   Smoking status: Every Day    Types: Cigarettes   Smokeless tobacco: Never  Vaping Use   Vaping status: Never Used  Substance and Sexual Activity   Alcohol use: Yes    Alcohol/week: 2.0 standard drinks of alcohol    Types: 2 Cans of beer per week   Drug use: Never   Sexual activity: Yes    Partners: Female  Other Topics Concern   Not on file  Social History Narrative   Not on file   Social Determinants of Health   Financial Resource Strain: Not on file  Food Insecurity: Not on file  Transportation Needs: Not on file  Physical Activity: Not on file  Stress: Not  on file  Social Connections: Not on file    History reviewed. No pertinent family history.  Health Maintenance  Topic Date Due   COVID-19 Vaccine (3 - Pfizer risk series) 11/11/2022 (Originally 06/08/2019)   INFLUENZA VACCINE  08/25/2022   DTaP/Tdap/Td (2 - Td or Tdap) 10/20/2031   Hepatitis C Screening  Completed   HIV Screening  Completed   HPV VACCINES  Aged Out     ----------------------------------------------------------------------------------------------------------------------------------------------------------------------------------------------------------------- Physical Exam BP 125/85   Pulse 66   Ht 5\' 10"  (1.778 m)   Wt 178 lb (80.7 kg)   SpO2 96%   BMI 25.54 kg/m   Physical Exam Constitutional:      Appearance: Normal appearance.  HENT:     Head: Normocephalic and atraumatic.     Mouth/Throat:     Comments: Mild posterior OP erythema and mild uvular edema.  Cardiovascular:     Rate and Rhythm: Normal rate and regular rhythm.  Pulmonary:     Effort: Pulmonary effort is normal.     Breath sounds: Normal breath sounds.  Musculoskeletal:     Cervical back: Neck supple.  Lymphadenopathy:     Cervical: No cervical adenopathy.  Neurological:     Mental Status: He is alert.     -------------------------------------------------------------------------------------------------------------------------------------------------------------------------------------------------------------------  Assessment and Plan  Throat irritation Continued posterior OP irritation.  Referral placed to ENT.    Screening for STD (sexually transmitted disease) Orders Placed This Encounter  Procedures   Chlamydia/Neisseria Gonorrhoeae RNA,TMA,Urogenital   Trichomonas vaginalis, RNA   HIV antibody (with reflex)   RPR     Situational anxiety Adding vistaril as needed.    Meds ordered this encounter  Medications   hydrOXYzine (ATARAX) 25 MG tablet    Sig: Take 1 tablet  (25 mg total) by mouth 3 (three) times daily as needed for anxiety.    Dispense:  30 tablet    Refill:  1    No follow-ups on file.    This visit occurred during the SARS-CoV-2 public health emergency.  Safety protocols were in place, including screening questions prior to the visit, additional usage of staff PPE, and extensive cleaning of exam room while observing appropriate contact time as indicated for disinfecting solutions.

## 2022-08-11 NOTE — Assessment & Plan Note (Signed)
Orders Placed This Encounter  Procedures   Chlamydia/Neisseria Gonorrhoeae RNA,TMA,Urogenital   Trichomonas vaginalis, RNA   HIV antibody (with reflex)   RPR

## 2022-08-11 NOTE — Assessment & Plan Note (Signed)
Adding vistaril as needed.

## 2022-08-11 NOTE — Addendum Note (Signed)
Addended by: Mammie Lorenzo on: 08/11/2022 01:03 PM   Modules accepted: Orders

## 2022-08-11 NOTE — Patient Instructions (Addendum)
I have entered a referral to ENT for the throat and mouth irritation.  Increase fluid intake and see if this helps as well.   Try hydroxyzine as needed for anxiety.  If this is not helpful let me know.

## 2022-08-13 LAB — HIV ANTIBODY (ROUTINE TESTING W REFLEX): HIV 1&2 Ab, 4th Generation: NONREACTIVE

## 2022-08-14 LAB — CHLAMYDIA/NEISSERIA GONORRHOEAE RNA,TMA,UROGENTIAL
C. trachomatis RNA, TMA: NOT DETECTED
N. gonorrhoeae RNA, TMA: NOT DETECTED

## 2022-08-14 LAB — SYPHILIS: RPR W/REFLEX TO RPR TITER AND TREPONEMAL ANTIBODIES, TRADITIONAL SCREENING AND DIAGNOSIS ALGORITHM: RPR Ser Ql: NONREACTIVE

## 2022-08-14 LAB — TRICHOMONAS VAGINALIS, PROBE AMP: Trichomonas vaginalis RNA: NOT DETECTED

## 2022-08-17 ENCOUNTER — Encounter: Payer: Self-pay | Admitting: Family Medicine

## 2022-08-23 NOTE — Telephone Encounter (Signed)
Document copied and given to [employer].   Front desk please scan to chart and email document to the patient or to the address on the form. Then send to scan. Thanks

## 2022-08-23 NOTE — Telephone Encounter (Signed)
Paperwork has been sent to the email on the form and sent to scan.

## 2022-08-30 IMAGING — DX DG RIBS W/ CHEST 3+V*L*
4 series · 4 of 4 positions shown · non-contrast
Comparison: None.

CLINICAL DATA: Injury, left upper chest and rib pain

EXAM:
LEFT RIBS AND CHEST - 3+ VIEW

[chest pa]
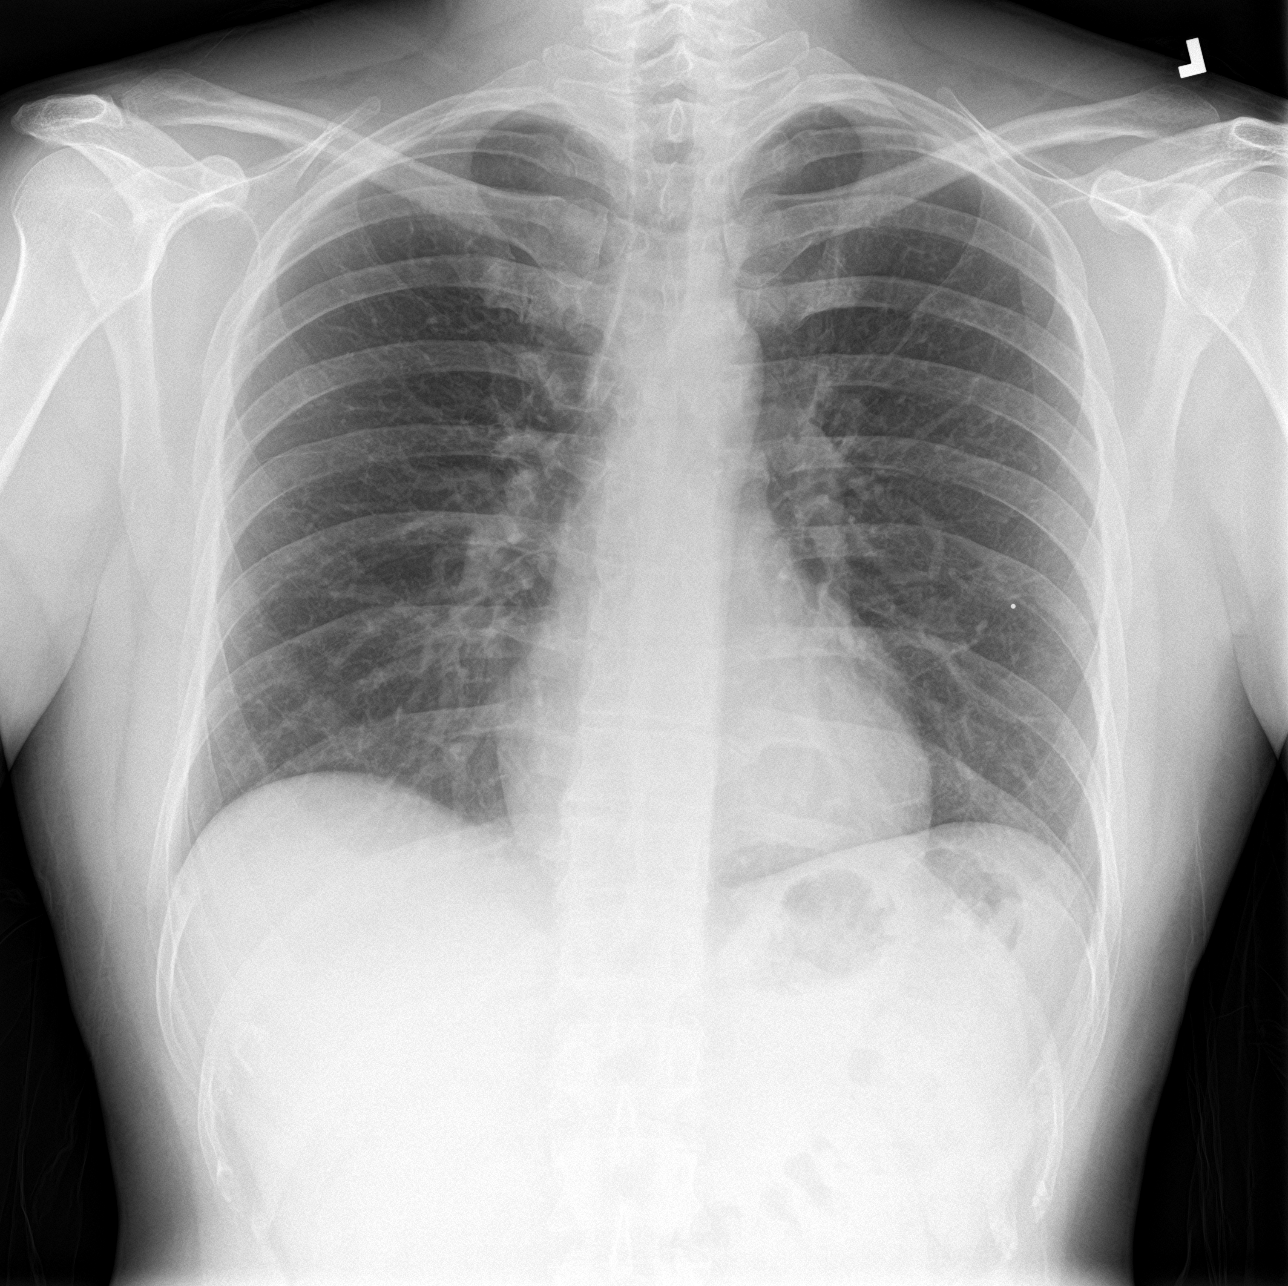

[rib pa]
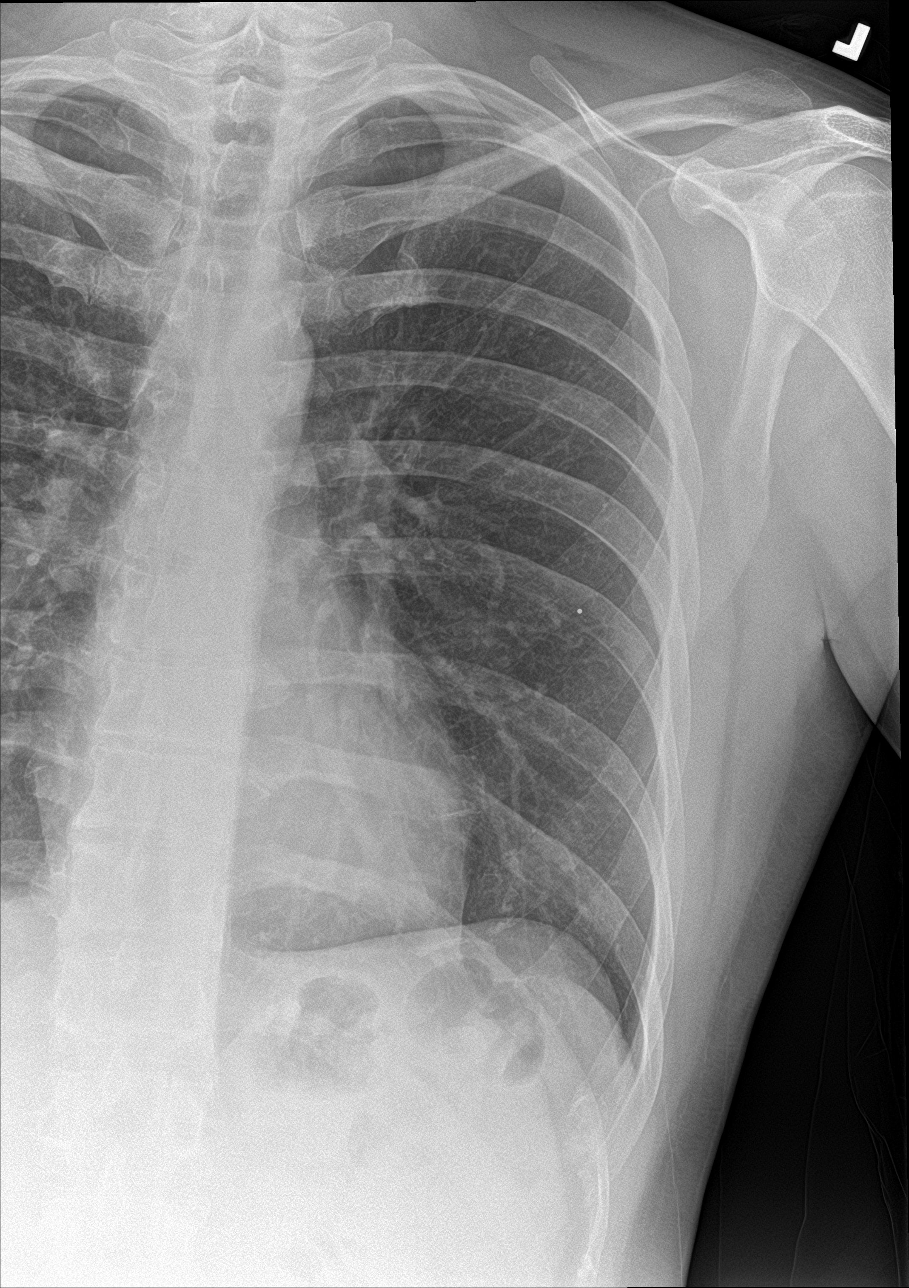

[rib pa obl (1 of 2)]
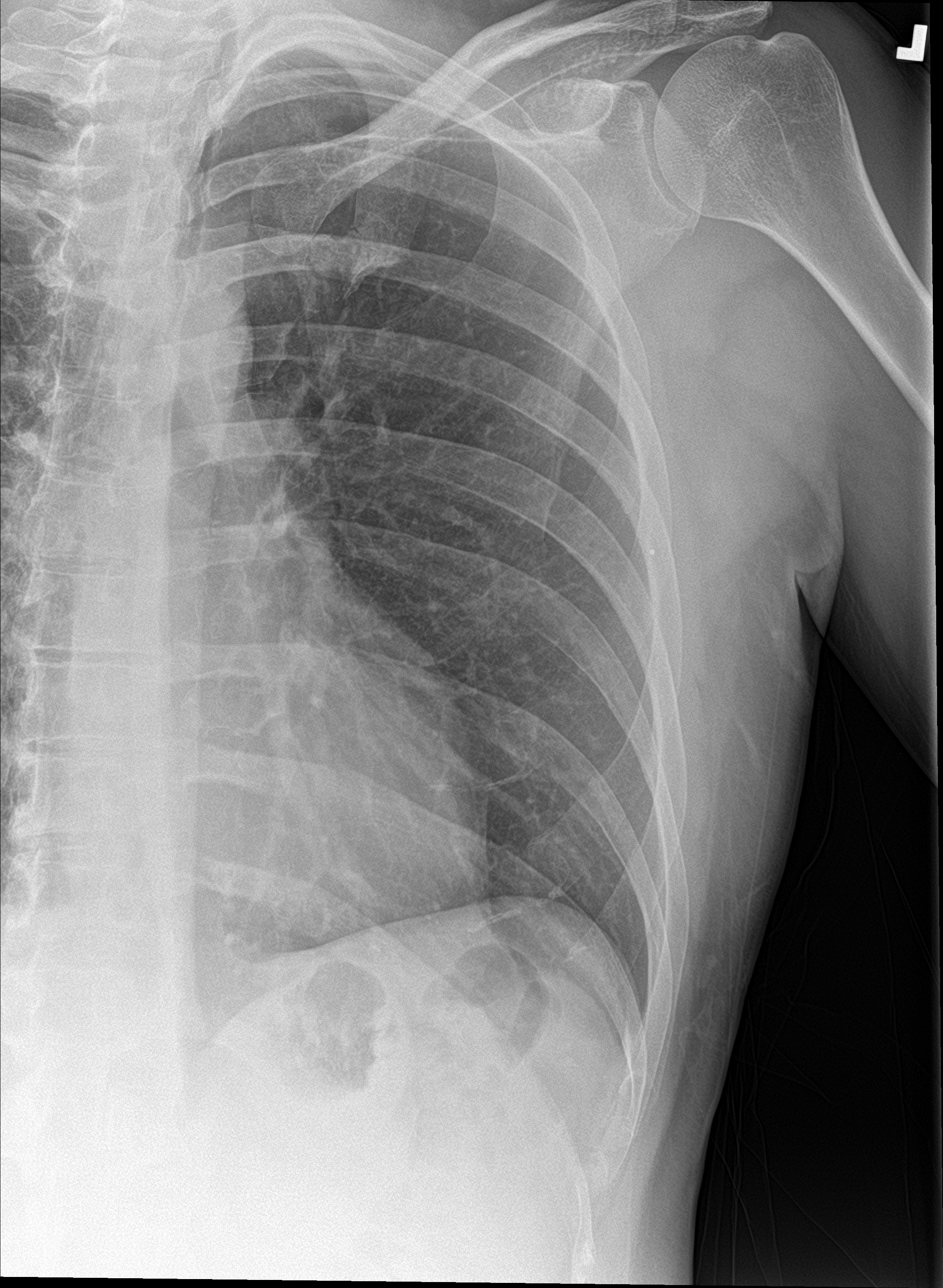

[rib pa obl (2 of 2)]
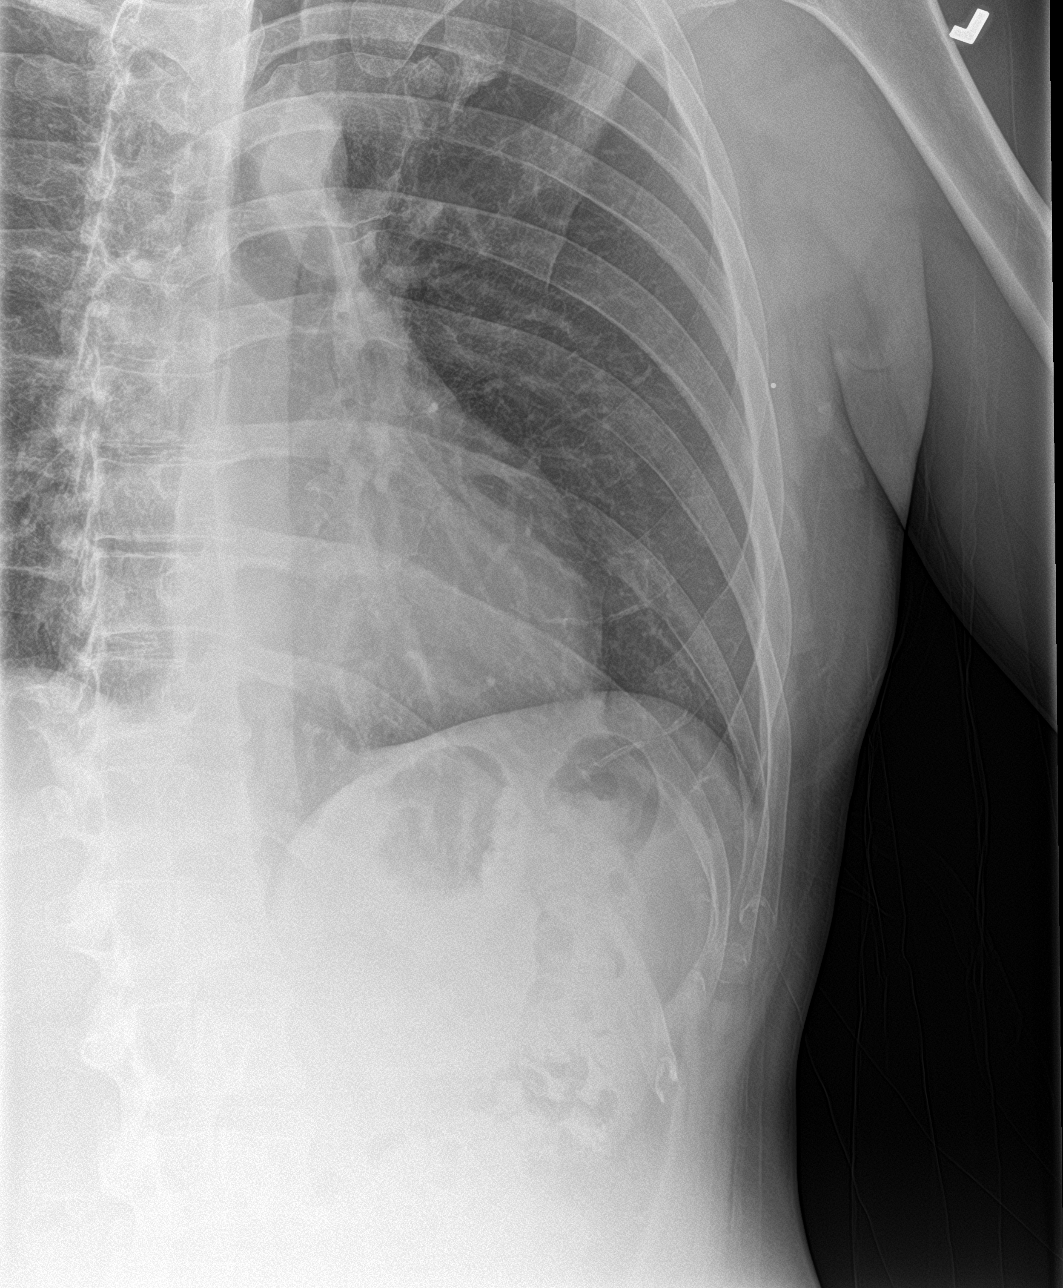

[4 of 4 positions shown; findings below may reference images not displayed]

FINDINGS: No fracture or other bone lesions are seen involving the ribs. There
is no evidence of pneumothorax or pleural effusion. Both lungs are
clear. Heart size and mediastinal contours are within normal limits.
IMPRESSION: Negative.

## 2023-02-22 ENCOUNTER — Encounter: Payer: BC Managed Care – PPO | Admitting: Family Medicine

## 2023-03-08 ENCOUNTER — Encounter: Payer: Self-pay | Admitting: Family Medicine

## 2023-03-08 ENCOUNTER — Ambulatory Visit (INDEPENDENT_AMBULATORY_CARE_PROVIDER_SITE_OTHER): Payer: BC Managed Care – PPO | Admitting: Family Medicine

## 2023-03-08 VITALS — BP 143/81 | HR 71 | Ht 70.0 in | Wt 178.0 lb

## 2023-03-08 DIAGNOSIS — Z0000 Encounter for general adult medical examination without abnormal findings: Secondary | ICD-10-CM | POA: Diagnosis not present

## 2023-03-08 DIAGNOSIS — K068 Other specified disorders of gingiva and edentulous alveolar ridge: Secondary | ICD-10-CM | POA: Diagnosis not present

## 2023-03-08 DIAGNOSIS — E785 Hyperlipidemia, unspecified: Secondary | ICD-10-CM

## 2023-03-08 DIAGNOSIS — Z113 Encounter for screening for infections with a predominantly sexual mode of transmission: Secondary | ICD-10-CM

## 2023-03-08 MED ORDER — TRIAMCINOLONE ACETONIDE 0.1 % MT PSTE: 1.0000 | PASTE | Freq: Two times a day (BID) | OROMUCOSAL | 1 refills | Status: AC

## 2023-03-08 NOTE — Assessment & Plan Note (Signed)
Adding triamcinolone paste.  Recommend f/u with dentist as well.

## 2023-03-08 NOTE — Patient Instructions (Signed)

## 2023-03-08 NOTE — Assessment & Plan Note (Signed)
Well adult Orders Placed This Encounter  Procedures   Chlamydia/Gonococcus/Trichomonas, NAA   CMP14+EGFR   CBC with Differential/Platelet   Lipid Panel With LDL/HDL Ratio   HIV antibody (with reflex)   RPR  Screenings: per lab orders Immunizations:  Declines flu vaccine.  Anticipatory guidance/Risk factor reduction:  Recommendations per AVS.

## 2023-03-08 NOTE — Progress Notes (Signed)
Bryan Downs - 43 y.o. male MRN [id number]  Date of birth: 02-15-1980  Subjective Chief Complaint  Patient presents with   Annual Exam    HPI Bryan Downs is a 43 y.o. here today for annual exam.  He reports that he is doing pretty well.  Has spot on gum that is irritated.   He is moderately active.  He feels that diet is pretty good most of the time. Marland Kitchen   He is using oral nicotine pouches.  Occasional EtOH use.   Review of Systems  Constitutional:  Negative for chills, fever, malaise/fatigue and weight loss.  HENT:  Negative for congestion, ear pain and sore throat.   Eyes:  Negative for blurred vision, double vision and pain.  Respiratory:  Negative for cough and shortness of breath.   Cardiovascular:  Negative for chest pain and palpitations.  Gastrointestinal:  Negative for abdominal pain, blood in stool, constipation, heartburn and nausea.  Genitourinary:  Negative for dysuria and urgency.  Musculoskeletal:  Negative for joint pain and myalgias.  Neurological:  Negative for dizziness and headaches.  Endo/Heme/Allergies:  Does not bruise/bleed easily.  Psychiatric/Behavioral:  Negative for depression. The patient is not nervous/anxious and does not have insomnia.     No Known Allergies  History reviewed. No pertinent past medical history.  History reviewed. No pertinent surgical history.  Social History   Socioeconomic History   Marital status: Single    Spouse name: Not on file   Number of children: Not on file   Years of education: Not on file   Highest education level: Bachelor's degree (e.g., BA, AB, BS)  Occupational History   Occupation: Journalist  Tobacco Use   Smoking status: Every Day    Types: Cigarettes   Smokeless tobacco: Never  Vaping Use   Vaping status: Never Used  Substance and Sexual Activity   Alcohol use: Yes    Alcohol/week: 2.0 standard drinks of alcohol    Types: 2 Cans of beer per week   Drug use: Never   Sexual activity:  Yes    Partners: Female  Other Topics Concern   Not on file  Social History Narrative   Not on file   Social Drivers of Health   Financial Resource Strain: Low Risk  (03/07/2023)   Overall Financial Resource Strain (CARDIA)    Difficulty of Paying Living Expenses: Not very hard  Food Insecurity: No Food Insecurity (03/07/2023)   Hunger Vital Sign    Worried About Running Out of Food in the Last Year: Never true    Ran Out of Food in the Last Year: Never true  Transportation Needs: No Transportation Needs (03/07/2023)   PRAPARE - Administrator, Civil Service (Medical): No    Lack of Transportation (Non-Medical): No  Physical Activity: Insufficiently Active (03/07/2023)   Exercise Vital Sign    Days of Exercise per Week: 3 days    Minutes of Exercise per Session: 20 min  Stress: No Stress Concern Present (03/07/2023)   [employer] of Occupational Health - Occupational Stress Questionnaire    Feeling of Stress : Only a little  Social Connections: Moderately Isolated (03/07/2023)   Social Connection and Isolation Panel [NHANES]    Frequency of Communication with Friends and Family: More than three times a week    Frequency of Social Gatherings with Friends and Family: Three times a week    Attends Religious Services: 1 to 4 times per year    Active Member  of Clubs or Organizations: No    Attends Banker Meetings: Not on file    Marital Status: Never married    History reviewed. No pertinent family history.  Health Maintenance  Topic Date Due   Pneumococcal Vaccine 43-90 Years old (1 of 2 - PCV) Never done   COVID-19 Vaccine (3 - Pfizer risk series) 06/08/2019   INFLUENZA VACCINE  04/24/2023 (Originally 08/25/2022)   DTaP/Tdap/Td (2 - Td or Tdap) 10/20/2031   Hepatitis C Screening  Completed   HIV Screening  Completed   HPV VACCINES  Aged Out      ----------------------------------------------------------------------------------------------------------------------------------------------------------------------------------------------------------------- Physical Exam BP (!) 143/81   Pulse 71   Ht 5\' 10"  (1.778 m)   Wt 178 lb (80.7 kg)   SpO2 97%   BMI 25.54 kg/m   Physical Exam Constitutional:      General: He is not in acute distress. HENT:     Head: Normocephalic and atraumatic.     Right Ear: Tympanic membrane and external ear normal.     Left Ear: Tympanic membrane and external ear normal.  Eyes:     General: No scleral icterus. Neck:     Thyroid: No thyromegaly.  Cardiovascular:     Rate and Rhythm: Normal rate and regular rhythm.     Heart sounds: Normal heart sounds.  Pulmonary:     Effort: Pulmonary effort is normal.     Breath sounds: Normal breath sounds.  Abdominal:     General: Bowel sounds are normal. There is no distension.     Palpations: Abdomen is soft.     Tenderness: There is no abdominal tenderness. There is no guarding.  Musculoskeletal:     Cervical back: Normal range of motion.  Lymphadenopathy:     Cervical: No cervical adenopathy.  Skin:    General: Skin is warm and dry.     Findings: No rash.  Neurological:     Mental Status: He is alert and oriented to person, place, and time.     Cranial Nerves: No cranial nerve deficit.     Motor: No abnormal muscle tone.  Psychiatric:        Mood and Affect: Mood normal.        Behavior: Behavior normal.     ------------------------------------------------------------------------------------------------------------------------------------------------------------------------------------------------------------------- Assessment and Plan  Well adult exam Well adult Orders Placed This Encounter  Procedures   Chlamydia/Gonococcus/Trichomonas, NAA   CMP14+EGFR   CBC with Differential/Platelet   Lipid Panel With LDL/HDL Ratio   HIV  antibody (with reflex)   RPR  Screenings: per lab orders Immunizations:  Declines flu vaccine.  Anticipatory guidance/Risk factor reduction:  Recommendations per AVS.   Gum lesion Adding triamcinolone paste.  Recommend f/u with dentist as well.    Meds ordered this encounter  Medications   triamcinolone (KENALOG) 0.1 % paste    Sig: Use as directed 1 Application in the mouth or throat 2 (two) times daily.    Dispense:  5 g    Refill:  1    Return in about 2 weeks (around 03/22/2023) for nurse visit for BP check.    This visit occurred during the SARS-CoV-2 public health emergency.  Safety protocols were in place, including screening questions prior to the visit, additional usage of staff PPE, and extensive cleaning of exam room while observing appropriate contact time as indicated for disinfecting solutions.

## 2023-03-09 LAB — CBC WITH DIFFERENTIAL/PLATELET
Basophils Absolute: 0.1 10*3/uL (ref 0.0–0.2)
Basos: 1 %
EOS (ABSOLUTE): 0.3 10*3/uL (ref 0.0–0.4)
Eos: 5 %
Hematocrit: 42 % (ref 37.5–51.0)
Hemoglobin: 14.3 g/dL (ref 13.0–17.7)
Immature Grans (Abs): 0 10*3/uL (ref 0.0–0.1)
Immature Granulocytes: 0 %
Lymphocytes Absolute: 1.8 10*3/uL (ref 0.7–3.1)
Lymphs: 35 %
MCH: 29.6 pg (ref 26.6–33.0)
MCHC: 34 g/dL (ref 31.5–35.7)
MCV: 87 fL (ref 79–97)
Monocytes Absolute: 0.6 10*3/uL (ref 0.1–0.9)
Monocytes: 11 %
Neutrophils Absolute: 2.5 10*3/uL (ref 1.4–7.0)
Neutrophils: 48 %
Platelets: 119 10*3/uL — ABNORMAL LOW (ref 150–450)
RBC: 4.83 x10E6/uL (ref 4.14–5.80)
RDW: 12.4 % (ref 11.6–15.4)
WBC: 5.2 10*3/uL (ref 3.4–10.8)

## 2023-03-09 LAB — CMP14+EGFR
ALT: 57 [IU]/L — ABNORMAL HIGH (ref 0–44)
AST: 31 [IU]/L (ref 0–40)
Albumin: 4.5 g/dL (ref 4.1–5.1)
Alkaline Phosphatase: 53 [IU]/L (ref 44–121)
BUN/Creatinine Ratio: 10 (ref 9–20)
BUN: 9 mg/dL (ref 6–24)
Bilirubin Total: 0.5 mg/dL (ref 0.0–1.2)
CO2: 23 mmol/L (ref 20–29)
Calcium: 9.4 mg/dL (ref 8.7–10.2)
Chloride: 101 mmol/L (ref 96–106)
Creatinine, Ser: 0.87 mg/dL (ref 0.76–1.27)
Globulin, Total: 2.1 g/dL (ref 1.5–4.5)
Glucose: 100 mg/dL — ABNORMAL HIGH (ref 70–99)
Potassium: 3.9 mmol/L (ref 3.5–5.2)
Sodium: 141 mmol/L (ref 134–144)
Total Protein: 6.6 g/dL (ref 6.0–8.5)
eGFR: 110 mL/min/{1.73_m2} (ref >59)

## 2023-03-09 LAB — LIPID PANEL WITH LDL/HDL RATIO
Cholesterol, Total: 163 mg/dL (ref 100–199)
HDL: 41 mg/dL (ref >39)
LDL Chol Calc (NIH): 108 mg/dL — ABNORMAL HIGH (ref 0–99)
LDL/HDL Ratio: 2.6 {ratio} (ref 0.0–3.6)
Triglycerides: 75 mg/dL (ref 0–149)
VLDL Cholesterol Cal: 14 mg/dL (ref 5–40)

## 2023-03-09 LAB — RPR: RPR Ser Ql: NONREACTIVE

## 2023-03-09 LAB — HIV ANTIBODY (ROUTINE TESTING W REFLEX): HIV Screen 4th Generation wRfx: NONREACTIVE

## 2023-03-12 LAB — CHLAMYDIA/GONOCOCCUS/TRICHOMONAS, NAA: Trich vag by NAA: NEGATIVE

## 2023-03-14 ENCOUNTER — Encounter: Payer: Self-pay | Admitting: Family Medicine

## 2023-04-06 ENCOUNTER — Ambulatory Visit: Payer: BC Managed Care – PPO

## 2023-06-06 ENCOUNTER — Other Ambulatory Visit: Payer: Self-pay | Admitting: Family Medicine

## 2023-06-06 DIAGNOSIS — N529 Male erectile dysfunction, unspecified: Secondary | ICD-10-CM

## 2023-06-06 NOTE — Telephone Encounter (Signed)
 Pls contact pt to schedule NV for HTN past due. Thanks

## 2023-06-06 NOTE — Telephone Encounter (Signed)
 Called patient he is scheduled on Thursday May 15th with the nurse

## 2023-06-08 ENCOUNTER — Encounter: Payer: Self-pay | Admitting: Family Medicine

## 2023-06-08 ENCOUNTER — Ambulatory Visit (INDEPENDENT_AMBULATORY_CARE_PROVIDER_SITE_OTHER)

## 2023-06-08 VITALS — BP 125/85 | HR 77 | Ht 70.0 in | Wt 178.0 lb

## 2023-06-08 DIAGNOSIS — R030 Elevated blood-pressure reading, without diagnosis of hypertension: Secondary | ICD-10-CM | POA: Diagnosis not present

## 2023-06-08 NOTE — Progress Notes (Signed)
 Patient is here for blood pressure check.   Previous BP was 143/81  1st BP today: 125/85  Denies chest pain, dizziness, shortness of breath, severe headache, or nosebleeds.  Taking medication as prescribed. Denies missed doses.

## 2023-08-29 ENCOUNTER — Telehealth: Payer: Self-pay

## 2023-08-29 DIAGNOSIS — E785 Hyperlipidemia, unspecified: Secondary | ICD-10-CM

## 2023-08-29 NOTE — Telephone Encounter (Signed)
 Copied from CRM [id number]. Topic: Clinical - Request for Lab/Test Order >> Aug 29, 2023  1:45 PM Zane F wrote: Reason for CRM:   Patient is calling to have an appointment scheduled for her to have his cholesterol levels checked. Please place order and contact the patient for scheduling.   Callback Number: [id number]

## 2023-08-29 NOTE — Telephone Encounter (Signed)
 Forwarding message to [employer] covering DR. Matthews   O.k. to order lipid panel? Last check in chart 02/12/225.

## 2023-08-31 ENCOUNTER — Encounter: Payer: Self-pay | Admitting: Family Medicine

## 2023-08-31 DIAGNOSIS — E785 Hyperlipidemia, unspecified: Secondary | ICD-10-CM

## 2023-09-04 NOTE — Telephone Encounter (Signed)
 Order placed. Pt aware Annabella Rigg, CMA

## 2023-09-26 ENCOUNTER — Encounter: Payer: Self-pay | Admitting: Sports Medicine

## 2023-10-03 ENCOUNTER — Ambulatory Visit: Admitting: Family Medicine

## 2023-10-03 ENCOUNTER — Encounter: Payer: Self-pay | Admitting: Family Medicine

## 2023-10-03 VITALS — BP 132/82 | HR 76 | Ht 70.0 in | Wt 171.0 lb

## 2023-10-03 DIAGNOSIS — N529 Male erectile dysfunction, unspecified: Secondary | ICD-10-CM | POA: Diagnosis not present

## 2023-10-03 DIAGNOSIS — Z113 Encounter for screening for infections with a predominantly sexual mode of transmission: Secondary | ICD-10-CM | POA: Diagnosis not present

## 2023-10-03 MED ORDER — SILDENAFIL CITRATE 100 MG PO TABS: ORAL_TABLET | ORAL | 3 refills | Status: AC

## 2023-10-03 NOTE — Progress Notes (Signed)
 Mccauley D Bricco - 43 y.o. male MRN [id number]  Date of birth: March 19, 1980  Subjective Chief Complaint  Patient presents with   Hypogonadism    HPI ARTICE BERGERSON is a 43 y.o. male here today for follow up visit.   He is in a new relationship and wants to have STI testing. He does have two small red dots on his scrotum that he wants looked at but denies pain or itching associated with this.  He has not had any other symptoms including penile discharge, testicular pain or dysuria.   He would like to have his sildenafil  renewed.   ROS:  A comprehensive ROS was completed and negative except as noted per HPI  No Known Allergies  No past medical history on file.  No past surgical history on file.  Social History   Socioeconomic History   Marital status: Single    Spouse name: Not on file   Number of children: Not on file   Years of education: Not on file   Highest education level: Bachelor's degree (e.g., BA, AB, BS)  Occupational History   Occupation: Journalist  Tobacco Use   Smoking status: Every Day    Types: Cigarettes   Smokeless tobacco: Never  Vaping Use   Vaping status: Never Used  Substance and Sexual Activity   Alcohol use: Yes    Alcohol/week: 2.0 standard drinks of alcohol    Types: 2 Cans of beer per week   Drug use: Never   Sexual activity: Yes    Partners: Female  Other Topics Concern   Not on file  Social History Narrative   Not on file   Social Drivers of Health   Financial Resource Strain: Low Risk  (03/07/2023)   Overall Financial Resource Strain (CARDIA)    Difficulty of Paying Living Expenses: Not very hard  Food Insecurity: No Food Insecurity (03/07/2023)   Hunger Vital Sign    Worried About Running Out of Food in the Last Year: Never true    Ran Out of Food in the Last Year: Never true  Transportation Needs: No Transportation Needs (03/07/2023)   PRAPARE - Administrator, Civil Service (Medical): No    Lack of Transportation  (Non-Medical): No  Physical Activity: Insufficiently Active (03/07/2023)   Exercise Vital Sign    Days of Exercise per Week: 3 days    Minutes of Exercise per Session: 20 min  Stress: No Stress Concern Present (03/07/2023)   [employer] of Occupational Health - Occupational Stress Questionnaire    Feeling of Stress : Only a little  Social Connections: Moderately Isolated (03/07/2023)   Social Connection and Isolation Panel    Frequency of Communication with Friends and Family: More than three times a week    Frequency of Social Gatherings with Friends and Family: Three times a week    Attends Religious Services: 1 to 4 times per year    Active Member of Clubs or Organizations: No    Attends Banker Meetings: Not on file    Marital Status: Never married    No family history on file.  Health Maintenance  Topic Date Due   Influenza Vaccine  04/23/2024 (Originally 08/25/2023)   Pneumococcal Vaccine (1 of 2 - PCV) 10/02/2024 (Originally 11/06/1999)   Hepatitis B Vaccines 19-59 Average Risk (1 of 3 - 19+ 3-dose series) 10/02/2024 (Originally 11/06/1999)   HPV VACCINES (1 - Risk 3-dose SCDM series) 10/02/2024 (Originally 11/06/2007)   COVID-19 Vaccine (3 -  Pfizer risk series) 10/18/2024 (Originally 06/08/2019)   DTaP/Tdap/Td (2 - Td or Tdap) 10/20/2031   Hepatitis C Screening  Completed   HIV Screening  Completed   Meningococcal B Vaccine  Aged Out     ----------------------------------------------------------------------------------------------------------------------------------------------------------------------------------------------------------------- Physical Exam BP 132/82 (BP Location: Left Arm, Patient Position: Sitting, Cuff Size: Normal)   Pulse 76   Ht 5' 10 (1.778 m)   Wt 171 lb (77.6 kg)   SpO2 99%   BMI 24.54 kg/m   Physical Exam Constitutional:      Appearance: Normal appearance.  Neurological:     General: No focal deficit present.     Mental  Status: He is alert.  Psychiatric:        Mood and Affect: Mood normal.        Behavior: Behavior normal.     ------------------------------------------------------------------------------------------------------------------------------------------------------------------------------------------------------------------- Assessment and Plan  Screening for STD (sexually transmitted disease) Orders Placed This Encounter  Procedures   Chlamydia/Gonococcus/Trichomonas, NAA   Mycoplasma / Ureaplasma Culture   HIV antibody (with reflex)   RPR       ED (erectile dysfunction) Doing well with sildenafil .  Rx renewed. Rx provided.  Side effects and red flags reviewed.    Meds ordered this encounter  Medications   sildenafil  (VIAGRA ) 100 MG tablet    Sig: TAKE 1/2 TO 1 TABLET BY MOUTH DAILY AS NEEDED FOR ERECTILE DYSFUNCTION    Dispense:  30 tablet    Refill:  3    No follow-ups on file.

## 2023-10-03 NOTE — Assessment & Plan Note (Signed)
 Orders Placed This Encounter  Procedures   Chlamydia/Gonococcus/Trichomonas, NAA   Mycoplasma / Ureaplasma Culture   HIV antibody (with reflex)   RPR

## 2023-10-03 NOTE — Assessment & Plan Note (Signed)
 Doing well with sildenafil .  Rx renewed. Rx provided.  Side effects and red flags reviewed.

## 2023-10-04 LAB — LIPID PANEL
Chol/HDL Ratio: 4.5 ratio (ref 0.0–5.0)
Cholesterol, Total: 195 mg/dL (ref 100–199)
HDL: 43 mg/dL (ref >39)
LDL Chol Calc (NIH): 136 mg/dL — ABNORMAL HIGH (ref 0–99)
Triglycerides: 88 mg/dL (ref 0–149)
VLDL Cholesterol Cal: 16 mg/dL (ref 5–40)

## 2023-10-05 LAB — CHLAMYDIA/GONOCOCCUS/TRICHOMONAS, NAA
Chlamydia by NAA: NEGATIVE
Gonococcus by NAA: NEGATIVE
Trich vag by NAA: NEGATIVE

## 2023-10-06 ENCOUNTER — Ambulatory Visit: Payer: Self-pay | Admitting: Family Medicine

## 2023-10-08 ENCOUNTER — Ambulatory Visit: Payer: Self-pay | Admitting: Family Medicine

## 2023-10-10 LAB — MYCOPLASMA / UREAPLASMA CULTURE
Mycoplasma hominis Culture: NEGATIVE
Ureaplasma urealyticum: NEGATIVE

## 2023-10-10 LAB — HIV ANTIBODY (ROUTINE TESTING W REFLEX): HIV Screen 4th Generation wRfx: NONREACTIVE

## 2023-10-10 LAB — RPR: RPR Ser Ql: NONREACTIVE

## 2024-02-21 ENCOUNTER — Encounter: Payer: BC Managed Care – PPO | Admitting: Family Medicine
